# Patient Record
Sex: Female | Born: 1990 | Race: Black or African American | Hispanic: No | Marital: Single | Smoking: Never smoker
Health system: Southern US, Community
[De-identification: ages and names within clinical notes are randomized; demographics above are authoritative.]

## PROBLEM LIST (undated history)

## (undated) DIAGNOSIS — G43909 Migraine, unspecified, not intractable, without status migrainosus: Secondary | ICD-10-CM

## (undated) HISTORY — DX: Migraine, unspecified, not intractable, without status migrainosus: G43.909

## (undated) HISTORY — PX: BREAST REDUCTION SURGERY: SHX8

---

## 2010-08-07 ENCOUNTER — Other Ambulatory Visit: Payer: Self-pay | Admitting: Family Medicine

## 2010-08-07 DIAGNOSIS — E010 Iodine-deficiency related diffuse (endemic) goiter: Secondary | ICD-10-CM

## 2010-08-27 ENCOUNTER — Ambulatory Visit
Admission: RE | Admit: 2010-08-27 | Discharge: 2010-08-27 | Disposition: A | Discharge status: Home / Self Care | Payer: 59 | Source: Ambulatory Visit | Attending: Family Medicine | Admitting: Family Medicine

## 2010-08-27 DIAGNOSIS — E010 Iodine-deficiency related diffuse (endemic) goiter: Secondary | ICD-10-CM

## 2012-01-21 IMAGING — US US SOFT TISSUE HEAD/NECK
1 series · 14 of 25 positions shown · non-contrast
Comparison: None.

CLINICAL DATA: Enlarged thyroid

THYROID ULTRASOUND
TECHNIQUE: Ultrasound examination of the thyroid gland and adjacent
soft tissues was performed.

[Series 1: us soft tissue head/neck · 0.06mm/px · 14 of 34 slices shown]
[im 1/34]
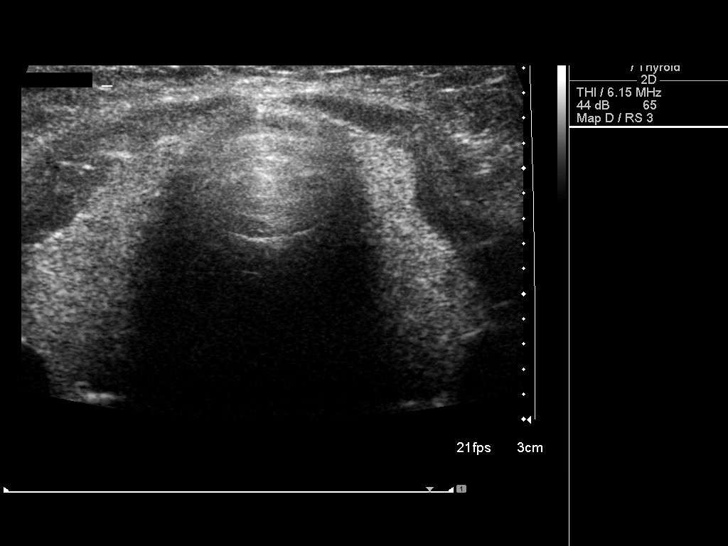
[im 3/34]
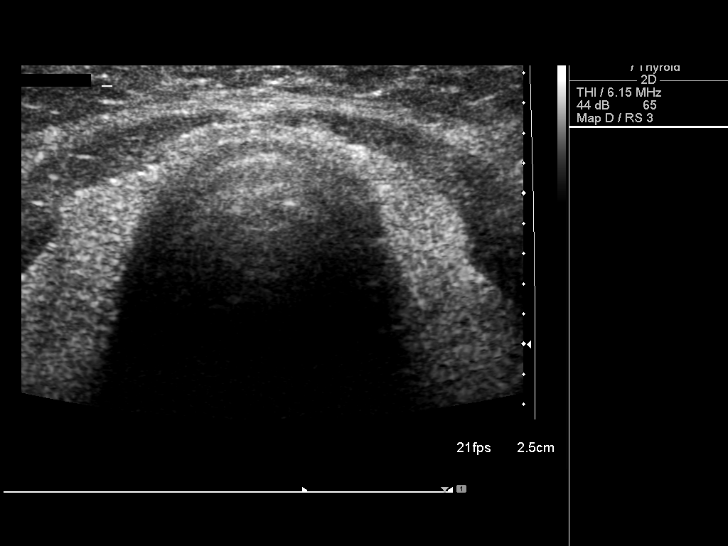
[im 6/34]
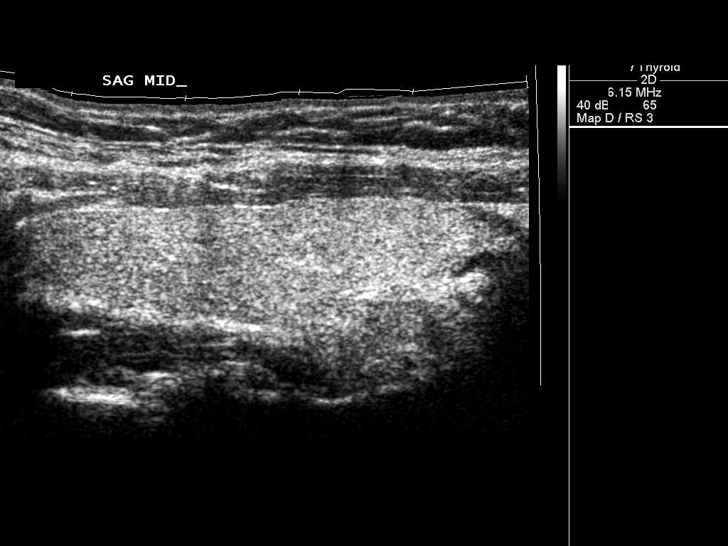
[im 9/34]
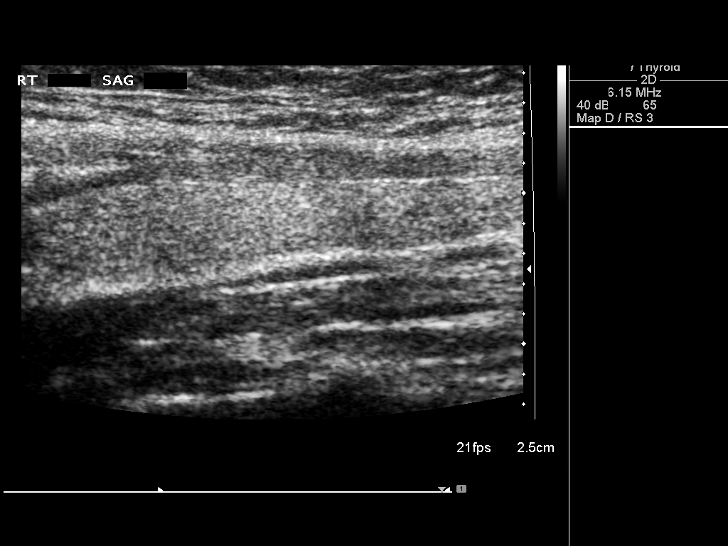
[im 12/34]
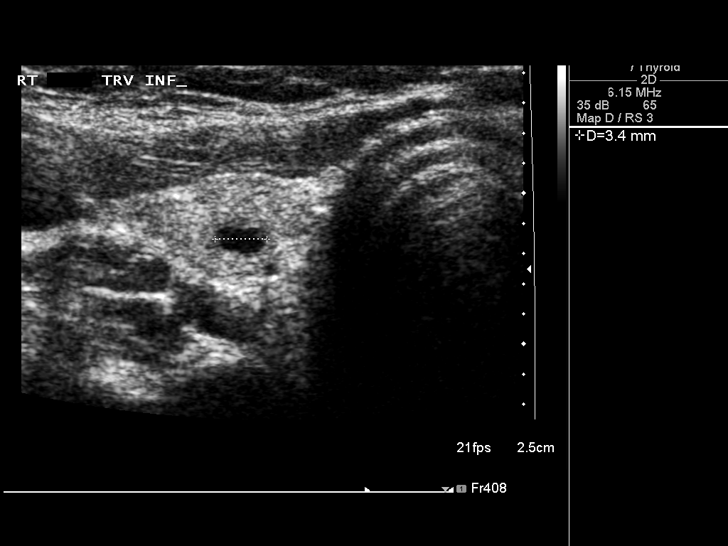
[im 13/34]
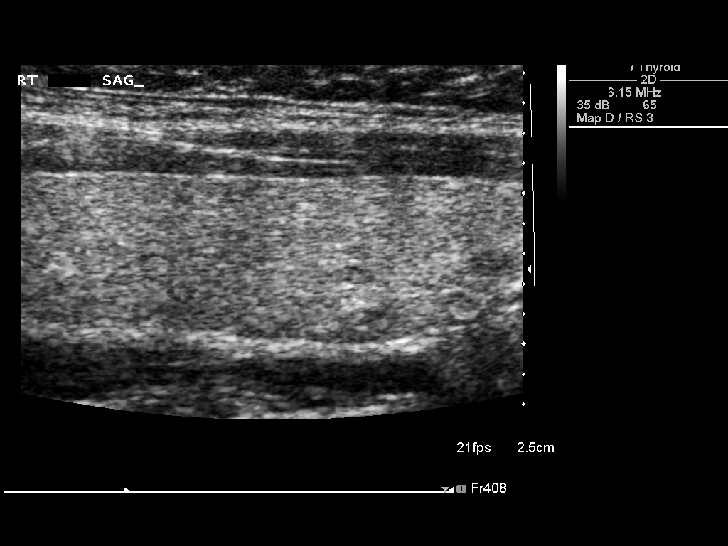
[im 16/34]
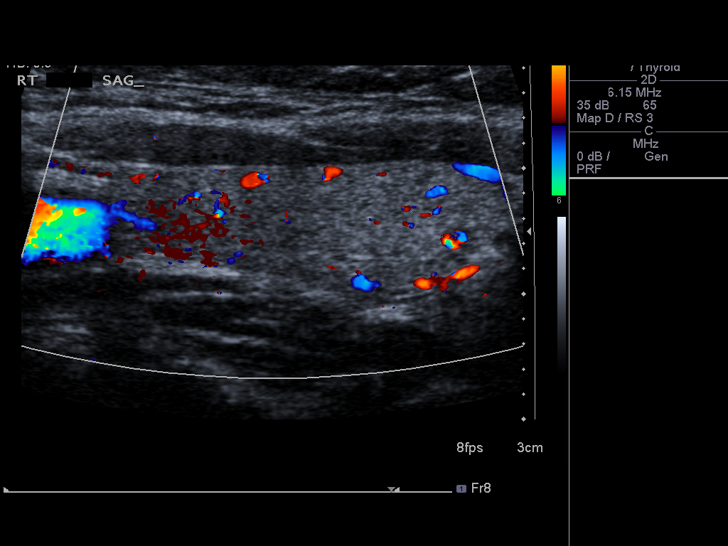
[im 18/34]
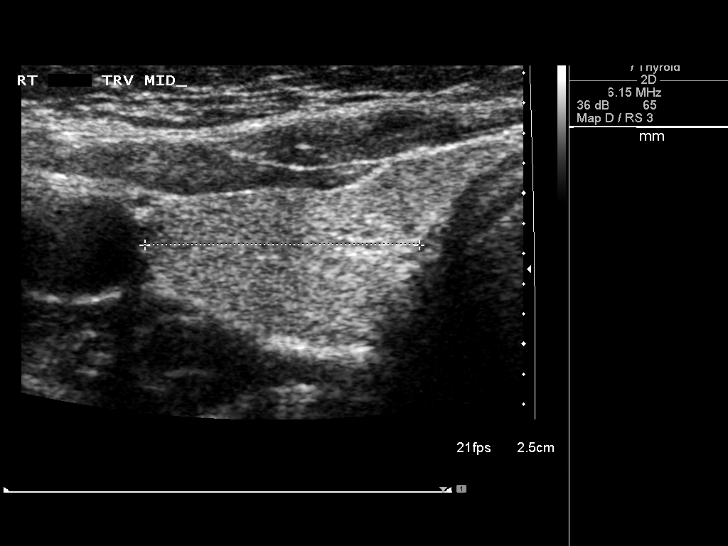
[im 21/34]
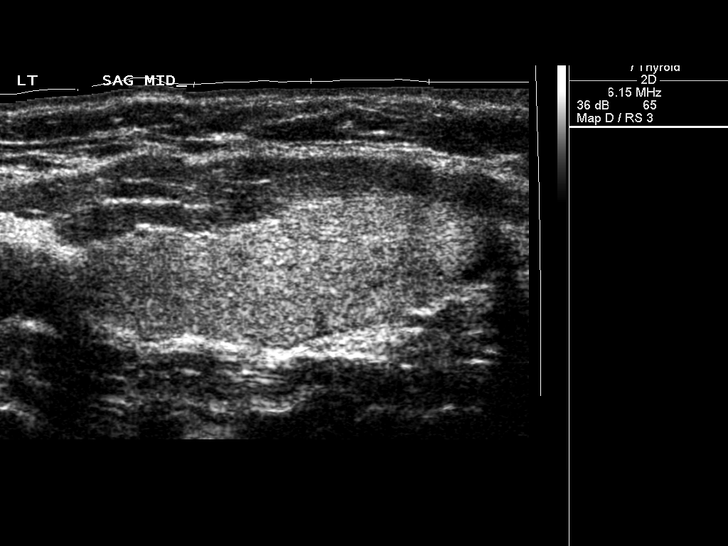
[im 23/34]
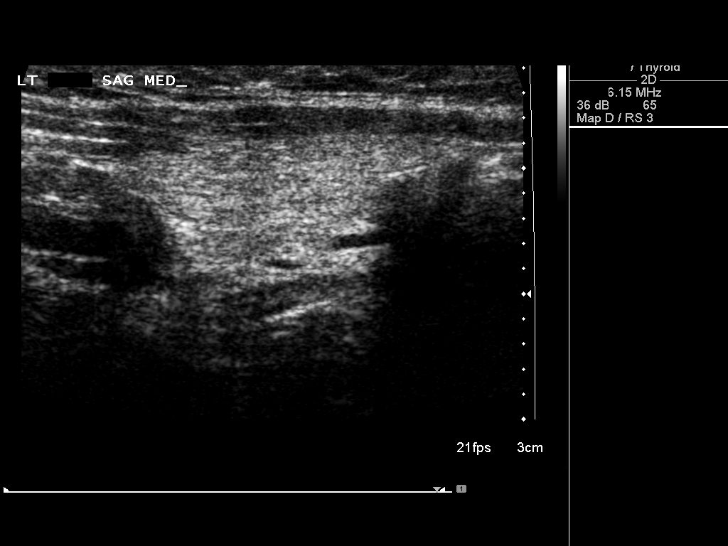
[im 25/34]
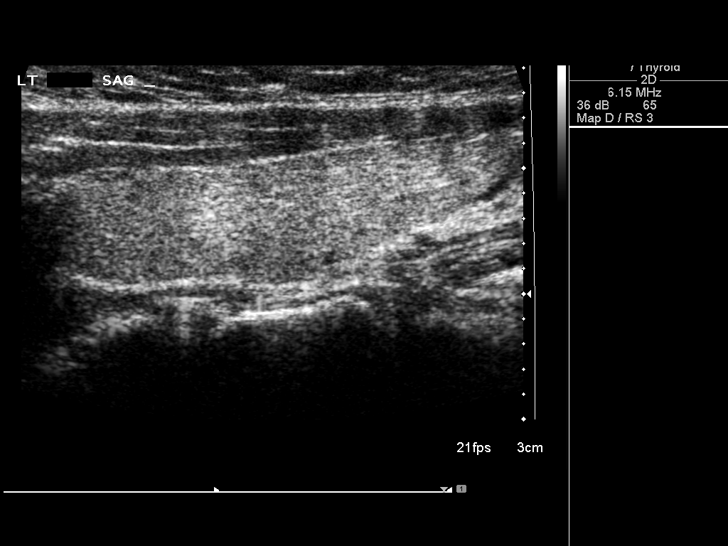
[im 28/34]
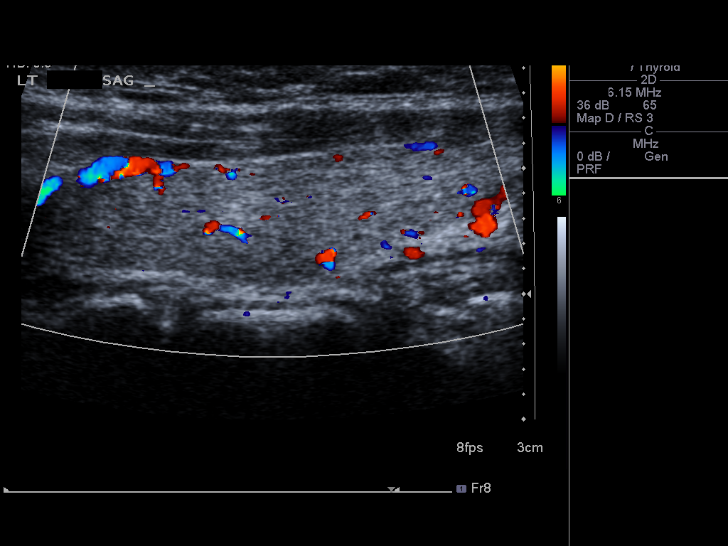
[im 31/34]
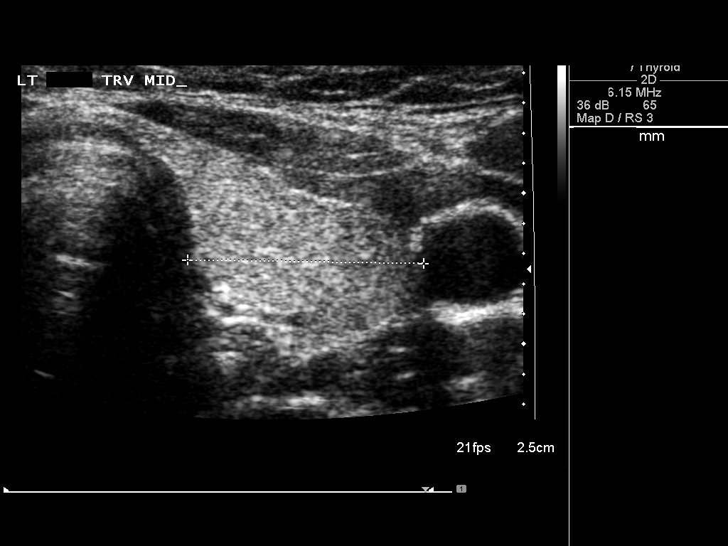
[im 34/34]
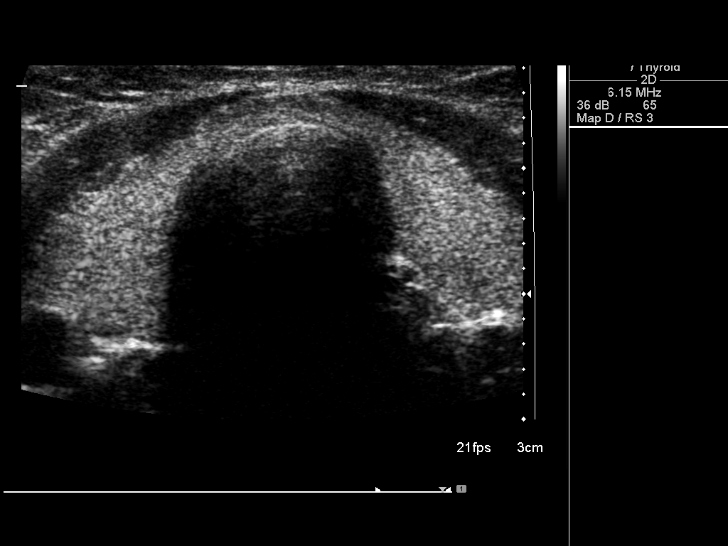

[14 of 25 positions shown; findings below may reference images not displayed]

FINDINGS: Right thyroid lobe:  4.3 x 1.2 x 1.8 cm.
Left thyroid lobe:  3.5 x 1.2 x 1.6 cm
Isthmus:    2 mm.

Focal nodules:  The thyroid gland is relatively homogeneous in
echogenicity. Only a single hypoechoic nodule in the lower pole on
the right is noted of 4 x 3 x 3 mm.

Lymphadenopathy:  None visualized.
IMPRESSION: The thyroid gland is normal in size with only a tiny hypoechoic
nodule of 4 mm in the lower pole on the right.

## 2015-03-29 ENCOUNTER — Other Ambulatory Visit: Payer: Self-pay | Admitting: Obstetrics and Gynecology

## 2015-03-29 ENCOUNTER — Other Ambulatory Visit (HOSPITAL_COMMUNITY)
Admission: RE | Admit: 2015-03-29 | Discharge: 2015-03-29 | Disposition: A | Discharge status: Home / Self Care | Payer: Self-pay | Source: Ambulatory Visit | Attending: Obstetrics and Gynecology | Admitting: Obstetrics and Gynecology

## 2015-03-29 DIAGNOSIS — Z01419 Encounter for gynecological examination (general) (routine) without abnormal findings: Secondary | ICD-10-CM | POA: Insufficient documentation

## 2015-03-31 LAB — CYTOLOGY - PAP

## 2015-06-17 ENCOUNTER — Telehealth: Payer: Self-pay | Admitting: Certified Nurse Midwife

## 2015-06-17 NOTE — Telephone Encounter (Signed)
Tiffany Cooper (06/12/1990), Pt of Dr. Dion BodyVarnado, called via [facility] answering service.  Return call to patient revealed  complaints of a vaginal bacterial infection and requesting a prescription to treat the infection.   Pt states "I know what they look like, have had one before".  Reports odor and increased vaginal discharge.  Had a paraguard placed 2 week ago.  Pt informed that CNM is not able to provide a perscription without evaluation. Pt assured discharge and odor is not emergency and advised to seek evaluation on Monday with her Provider.  Pt stated she understands and was asked what can be done to help before Monday.  Cotton underwear and loose fitting clothing suggested for vaginal hygiene prior to treatment.  Aretta Nip. Amoria Mclees, CNM

## 2016-10-14 ENCOUNTER — Ambulatory Visit: Payer: BLUE CROSS/BLUE SHIELD | Admitting: Neurology

## 2016-10-15 ENCOUNTER — Encounter: Payer: Self-pay | Admitting: Neurology

## 2022-02-08 LAB — HM PAP SMEAR: HM Pap smear: ABNORMAL

## 2022-03-21 ENCOUNTER — Ambulatory Visit: Payer: Self-pay | Admitting: Family Medicine

## 2022-12-03 ENCOUNTER — Encounter: Payer: Self-pay | Admitting: Family

## 2022-12-03 ENCOUNTER — Ambulatory Visit (INDEPENDENT_AMBULATORY_CARE_PROVIDER_SITE_OTHER): Payer: Commercial Managed Care - PPO | Admitting: Family

## 2022-12-03 VITALS — BP 112/72 | HR 69 | Ht 61.0 in | Wt 163.0 lb

## 2022-12-03 DIAGNOSIS — G43009 Migraine without aura, not intractable, without status migrainosus: Secondary | ICD-10-CM | POA: Diagnosis not present

## 2022-12-03 DIAGNOSIS — Z23 Encounter for immunization: Secondary | ICD-10-CM

## 2022-12-03 NOTE — Patient Instructions (Signed)
Welcome to [facility], It was a pleasure meeting you today!   We talked about Sumatriptan (Imitrex) as an as needed treatment for your migraines. Let me know if this is something you want to try. You can try taking 1-2 tablets of Naproxen (Aleve) starting 1-2 days prior to your menstrual cycle and during the cycle to prevent or minimize migraines during this time. See the handout attached regarding migraines.  You can schedule a physical with fasting labs at your convenience today.    PLEASE NOTE: If you had any LAB tests please let us know if you have not heard back within a few days. You may see your results on MyChart before we have a chance to review them but we will give you a call once they are reviewed by [place]. If we ordered any REFERRALS today, please let us know if you have not heard from their office within the next week.  Let us know through MyChart if you are needing REFILLS, or have your pharmacy send [place] the request. You can also use MyChart to communicate with me or any office staff.  Please try these tips to maintain a healthy lifestyle: It is important that you exercise regularly at least 30 minutes 5 times a week. Think about what you will eat, plan ahead. Choose whole foods, & think  "clean, green, fresh or frozen" over canned, processed or packaged foods which are more sugary, salty, and fatty. 70 to 75% of food eaten should be fresh vegetables and protein. 2-3  meals daily with healthy snacks between meals, but must be whole fruit, protein or vegetables. Aim to eat over a 10 hour period when you are active, for example, 7am to 5pm, and then STOP after your last meal of the day, drinking only water.  Shorter eating windows, 6-8 hours, are showing benefits in heart disease and blood sugar regulation. Drink water every day! Shoot for 64 ounces daily = 8 cups, no other drink is as healthy! Fruit juice is best enjoyed in a healthy way, by EATING the  fruit.

## 2022-12-03 NOTE — Progress Notes (Signed)
New Patient Office Visit  Subjective:  Patient ID: Tiffany Cooper, female    DOB: 10-Jan-1991  Age: 32 y.o. MRN: [id number]  CC:  Chief Complaint  Patient presents with   Establish Care    Pt states she recently has been having more migraines; Pt has had meds for issue before and did not like the way it made her feel.   Last Pap: 2023- Does not have GYN Yes to tdap.     HPI Tiffany Cooper presents for establishing care today.  *Discussed the use of AI scribe software for clinical note transcription with the patient, who gave verbal consent to proceed.  History of Present Illness   The patient, a flight attendant, presents as a new patient, with a long-standing history of migraines. She reports an increase in the severity and frequency of these migraines over the past two and a half months, with two episodes being particularly extreme. One episode was related to her menses (usually occurs before & during cycle), while the other occurred during a viral illness. The patient also developed a cold sore during this time. 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 She also reports being seen for migraines in her 94s - had migrainous vertigo- but no episodes since that time. She also does not have nausea with current migraines. The patient's migraines typically do not present with auras. However, during the first severe episode, she experienced extreme pain triggered by smells, which was unusual for her. The patient manages her migraines with over-the-counter medications, including Tylenol and Excedrin, which are usually effective if taken at the onset of symptoms. The patient has a family history of migraines, with her mother having suffered from severe migraines when she was  younger. The patient has been keeping a headache journal to track her symptoms and potential triggers.      Assessment & Plan:     Migraines - History of migraines, with recent increase in severity and frequency. Migraines are typically managed with over-the-counter medications (Tylenol, Excedrin). Patient experiences migraines associated with menstrual cycle and has a family history of migraines. Recent migraines have been associated with sensory changes (increased sensitivity to smells). -Consider prescription for sumatriptan to be used as needed for severe migraines. -Advise patient to continue tracking migraines in headache journal. -Recommend taking 1-2 tabs of OTC naproxen bid a couple of days before menstrual cycle to offset hormone-related migraines. -Follow-up as needed if migraines increase in frequency or severity, or if patient wishes to try prescription medication.  General Health Maintenance -Schedule a physical with fasting labs at patient's convenience.     Immunization due -     Tdap vaccine greater than or equal to 7yo IM  Subjective:    No outpatient medications prior to visit.   No facility-administered medications prior to visit.   History reviewed. No pertinent past medical history. History reviewed. No pertinent surgical history.  Objective:   Today's Vitals: BP 112/72   Pulse 69   Ht 5\' 1"  (1.549 m)   Wt 163 lb (73.9 kg)   LMP 12/02/2022   SpO2 99%   BMI 30.80 kg/m   Physical Exam Vitals and nursing note reviewed.  Constitutional:      Appearance: Normal appearance.  Cardiovascular:     Rate and Rhythm: Normal  rate and regular rhythm.  Pulmonary:     Effort: Pulmonary effort is normal.     Breath sounds: Normal breath sounds.  Musculoskeletal:        General: Normal range of motion.  Skin:    General: Skin is warm and dry.  Neurological:     Mental Status: She is alert.  Psychiatric:        Mood and Affect: Mood normal.        Behavior:  Behavior normal.     Dulce Sellar, NP

## 2022-12-03 NOTE — Assessment & Plan Note (Signed)
Chronic, intermittent, mother has migraine hx recent increase in severity and frequency managed with over-the-counter medications (Tylenol, Excedrin) experiences migraines associated with menstrual cycle Recent migraines have been associated with increased sensitivity to smells Discussed sumatriptan to be used as needed for severe migraines. continue tracking migraines in headache journal. Recommend taking 1-2 tabs of OTC naproxen bid a couple of days before menstrual cycle to offset hormone-related migraines. Follow-up as needed

## 2022-12-03 NOTE — Progress Notes (Deleted)
  Phone [phone]  Subjective:   Patient is a 32 y.o. female presenting for annual physical.    No chief complaint on file.   See problem oriented charting- ROS- full  review of systems was completed and negative except for *** noted in HPI above.  The following were reviewed and entered/updated in epic: No past medical history on file. There are no problems to display for this patient.  *** The histories are not reviewed yet. Please review them in the "History" navigator section and refresh this SmartLink.  No family history on file.  Medications- reviewed and updated No current outpatient medications on file.   No current facility-administered medications for this visit.    Allergies-reviewed and updated Not on File  Social History   Social History Narrative   Not on file    Objective:  There were no vitals taken for this visit. Physical Exam Vitals and nursing note reviewed.  Constitutional:      Appearance: Normal appearance.  HENT:     Head: Normocephalic.     Right Ear: Tympanic membrane normal.     Left Ear: Tympanic membrane normal.     Nose: Nose normal.     Mouth/Throat:     Mouth: Mucous membranes are moist.  Eyes:     Pupils: Pupils are equal, round, and reactive to light.  Cardiovascular:     Rate and Rhythm: Normal rate and regular rhythm.  Pulmonary:     Effort: Pulmonary effort is normal.     Breath sounds: Normal breath sounds.  Musculoskeletal:        General: Normal range of motion.     Cervical back: Normal range of motion.  Lymphadenopathy:     Cervical: No cervical adenopathy.  Skin:    General: Skin is warm and dry.  Neurological:     Mental Status: She is alert.  Psychiatric:        Mood and Affect: Mood normal.        Behavior: Behavior normal.       Assessment and Plan   Health Maintenance counseling: 1. Anticipatory guidance: Patient counseled regarding regular dental exams q6 months, eye exams,  avoiding smoking  and second hand smoke, limiting alcohol to 1 beverage per day, no illicit drugs.   2. Risk factor reduction:  Advised patient of need for regular exercise and diet rich with fruits and vegetables to reduce risk of heart attack and stroke. Wt Readings from Last 3 Encounters:  No data found for Wt   3. Immunizations/screenings/ancillary studies  There is no immunization history on file for this patient. Health Maintenance Due  Topic Date Due   HIV Screening  Never done   Hepatitis C Screening  Never done   DTaP/Tdap/Td (1 - Tdap) Never done   Cervical Cancer Screening (HPV/Pap Cotest)  04/29/2020   INFLUENZA VACCINE  Never done   COVID-19 Vaccine (1 - 2023-24 season) Never done    4. Cervical cancer screening: *** 5. Skin cancer screening- advised regular sunscreen use. Denies worrisome, changing, or new skin lesions.  6. Birth control/STD check: *** 7. Smoking associated screening: *** smoker 8. Alcohol screening: ***  There are no diagnoses linked to this encounter.  Recommended follow up: ***No follow-ups on file. Future Appointments  Date Time Provider Department Center  12/03/2022  9:00 AM Dulce Sellar, NP [facility]    Lab/Order associations:fasting    Dulce Sellar, NP

## 2023-01-03 ENCOUNTER — Telehealth: Payer: Self-pay | Admitting: Family

## 2023-01-03 DIAGNOSIS — G43009 Migraine without aura, not intractable, without status migrainosus: Secondary | ICD-10-CM

## 2023-01-03 NOTE — Telephone Encounter (Signed)
Patient states she would like to try Sumatriptan (Imitrex) as an as needed treatment for migraines.   Requests RX for the above medication be sent to:  [facility] [address] [place], [place] - [address] Phone: [phone]  Fax: [phone]

## 2023-01-07 MED ORDER — CYCLOBENZAPRINE HCL 5 MG PO TABS: 5.0000 mg | ORAL_TABLET | Freq: Three times a day (TID) | ORAL | 2 refills | Status: DC | PRN

## 2023-01-07 MED ORDER — SUMATRIPTAN SUCCINATE 50 MG PO TABS: 50.0000 mg | ORAL_TABLET | ORAL | 2 refills | Status: DC | PRN

## 2023-01-07 NOTE — Telephone Encounter (Signed)
I have in med - also sent in generic Flexeril to take with the sumatriptan for better results- instructions are on the bottles

## 2023-01-07 NOTE — Telephone Encounter (Signed)
I called pt and LVM in regards to message below.

## 2023-01-08 ENCOUNTER — Ambulatory Visit: Payer: Commercial Managed Care - PPO | Admitting: Family

## 2023-01-08 VITALS — BP 108/76 | HR 73 | Temp 98.0°F | Ht 61.0 in | Wt 161.5 lb

## 2023-01-08 DIAGNOSIS — Z1159 Encounter for screening for other viral diseases: Secondary | ICD-10-CM | POA: Diagnosis not present

## 2023-01-08 DIAGNOSIS — B009 Herpesviral infection, unspecified: Secondary | ICD-10-CM | POA: Insufficient documentation

## 2023-01-08 DIAGNOSIS — Z0000 Encounter for general adult medical examination without abnormal findings: Secondary | ICD-10-CM | POA: Diagnosis not present

## 2023-01-08 LAB — CBC WITH DIFFERENTIAL/PLATELET
Basophils Absolute: 0 10*3/uL (ref 0.0–0.1)
Basophils Relative: 0.5 % (ref 0.0–3.0)
Eosinophils Absolute: 0.1 10*3/uL (ref 0.0–0.7)
Eosinophils Relative: 1.7 % (ref 0.0–5.0)
HCT: 41.5 % (ref 36.0–46.0)
Hemoglobin: 14.2 g/dL (ref 12.0–15.0)
Lymphocytes Relative: 23.7 % (ref 12.0–46.0)
Lymphs Abs: 1.5 10*3/uL (ref 0.7–4.0)
MCHC: 34.2 g/dL (ref 30.0–36.0)
MCV: 90 fL (ref 78.0–100.0)
Monocytes Absolute: 0.4 10*3/uL (ref 0.1–1.0)
Monocytes Relative: 6.3 % (ref 3.0–12.0)
Neutro Abs: 4.4 10*3/uL (ref 1.4–7.7)
Neutrophils Relative %: 67.8 % (ref 43.0–77.0)
Platelets: 346 10*3/uL (ref 150.0–400.0)
RBC: 4.61 Mil/uL (ref 3.87–5.11)
RDW: 12.5 % (ref 11.5–15.5)
WBC: 6.4 10*3/uL (ref 4.0–10.5)

## 2023-01-08 LAB — COMPREHENSIVE METABOLIC PANEL
ALT: 12 U/L (ref 0–35)
AST: 18 U/L (ref 0–37)
Albumin: 4.4 g/dL (ref 3.5–5.2)
Alkaline Phosphatase: 47 U/L (ref 39–117)
BUN: 6 mg/dL (ref 6–23)
CO2: 27 meq/L (ref 19–32)
Calcium: 9.3 mg/dL (ref 8.4–10.5)
Chloride: 104 meq/L (ref 96–112)
Creatinine, Ser: 0.78 mg/dL (ref 0.40–1.20)
GFR: 100.31 mL/min (ref >60.00)
Glucose, Bld: 85 mg/dL (ref 70–99)
Potassium: 3.9 meq/L (ref 3.5–5.1)
Sodium: 137 meq/L (ref 135–145)
Total Bilirubin: 0.5 mg/dL (ref 0.2–1.2)
Total Protein: 7.4 g/dL (ref 6.0–8.3)

## 2023-01-08 LAB — LIPID PANEL
Cholesterol: 190 mg/dL (ref 0–200)
HDL: 52.6 mg/dL (ref >39.00)
LDL Cholesterol: 120 mg/dL — ABNORMAL HIGH (ref 0–99)
NonHDL: 137.46
Total CHOL/HDL Ratio: 4
Triglycerides: 86 mg/dL (ref 0.0–149.0)
VLDL: 17.2 mg/dL (ref 0.0–40.0)

## 2023-01-08 LAB — TSH: TSH: 1.5 u[IU]/mL (ref 0.35–5.50)

## 2023-01-08 NOTE — Assessment & Plan Note (Signed)
started Imitrex w/Flexeril via MyChart message 12/2022

## 2023-01-08 NOTE — Progress Notes (Signed)
Phone [phone]  Subjective:   Patient is a 32 y.o. female presenting for annual physical.    Chief Complaint  Patient presents with   Annual Exam    Fasting w/ labs    See problem oriented charting- ROS- full  review of systems was completed and negative.  The following were reviewed and entered/updated in epic: No past medical history on file. 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  No family history on file.  Medications- reviewed and updated Current Outpatient Medications  Medication Sig Dispense Refill   cyclobenzaprine (FLEXERIL) 5 MG tablet Take 1-2 tablets (5-10 mg total) by mouth 3 (three) times daily as needed for muscle spasms (Migraine). Take at first sign of migraine with Sumatriptan 30 tablet 2   SUMAtriptan (IMITREX) 50 MG tablet Take 1-2 tablets (50-100 mg total) by mouth every 2 (two) hours as needed for migraine (Take at first sign of migraine with Flexeril). May repeat in 2 hours if headache persists or recurs. 30 tablet 2   No current facility-administered medications for this visit.    Allergies-reviewed and updated Allergies  Allergen Reactions   Other     Oysters     Social History   Social History Narrative   Not on file    Objective:  BP 108/76 (BP Location: Left Arm, Patient Position: Sitting, Cuff Size: Large)   Pulse 73   Temp 98 F (36.7 C) (Temporal)   Ht 5\' 1"  (1.549 m)   Wt 161 lb 8 oz (73.3 kg)   LMP 12/28/2022 (Exact Date)   SpO2 98%   BMI 30.52 kg/m  Physical Exam Vitals and nursing note reviewed.  Constitutional:      Appearance: Normal appearance.  HENT:     Head: Normocephalic.     Right Ear: Tympanic membrane normal.     Left Ear: Tympanic membrane normal.     Nose: Nose normal.     Mouth/Throat:     Mouth: Mucous membranes  are moist.  Eyes:     Pupils: Pupils are equal, round, and reactive to light.  Cardiovascular:     Rate and Rhythm: Normal rate and regular rhythm.  Pulmonary:     Effort: Pulmonary effort is normal.     Breath sounds: Normal breath sounds.  Musculoskeletal:        General: Normal range of motion.     Cervical back: Normal range of motion.  Lymphadenopathy:     Cervical: No cervical adenopathy.  Skin:    General: Skin is warm and dry.  Neurological:     Mental Status: She is alert.  Psychiatric:        Mood and Affect: Mood normal.        Behavior: Behavior normal.      Assessment and Plan   Health Maintenance counseling: 1. Anticipatory guidance: Patient counseled regarding regular dental exams q6 months, eye exams,  avoiding smoking and second hand smoke, limiting alcohol to 1 beverage per day, no illicit drugs.   2. Risk factor reduction:  Advised patient of need for regular exercise and diet rich with fruits and vegetables to reduce risk of heart attack and stroke. Wt Readings from Last 3 Encounters:  01/08/23 161 lb 8 oz (73.3 kg)  12/03/22 163 lb (73.9 kg)   3. Immunizations/screenings/ancillary studies Immunization  History  Administered Date(s) Administered   MODERNA COVID-19 SARS-COV-2 PEDS BIVALENT BOOSTER 41yr-25yr 07/26/2020, 09/25/2020   Tdap 12/03/2022   Health Maintenance Due  Topic Date Due   HIV Screening  Never done   Hepatitis C Screening  Never done   Cervical Cancer Screening (HPV/Pap Cotest)  04/29/2020   COVID-19 Vaccine (3 - 2023-24 season) 10/27/2022    4. Cervical cancer screening: due 2025 5. Skin cancer screening- advised regular sunscreen use. Denies worrisome, changing, or new skin lesions.  6. Birth control/STD check: Paragard - reports hx of 2 pregnancies while on paragard, prefers not to use hormonal BC 7. Smoking associated screening: non- smoker 8. Alcohol screening: rare  Annual physical exam -     HIV Antibody (routine testing w  rflx) -     TSH -     Lipid panel -     CBC with Differential/Platelet -     Comprehensive metabolic panel  Need for hepatitis C screening test -     Hepatitis C antibody   Recommended follow up:  Return in about 6 months (around 07/08/2023) for med refills. Future Appointments  Date Time Provider Department Center  07/09/2023 11:30 AM Dulce Sellar, NP [facility]    Lab/Order associations: fasting    Dulce Sellar, NP

## 2023-01-08 NOTE — Patient Instructions (Addendum)
It was very nice to see you today!   I will review your lab results via MyChart in a few days.   Have a great week!    PLEASE NOTE:  If you had any lab tests please let us know if you have not heard back within a few days. You may see your results on MyChart before we have a chance to review them but we will give you a call once they are reviewed by [place]. If we ordered any referrals today, please let us know if you have not heard from their office within the next week.

## 2023-01-09 LAB — HEPATITIS C ANTIBODY: Hepatitis C Ab: NONREACTIVE

## 2023-01-09 LAB — HIV ANTIBODY (ROUTINE TESTING W REFLEX): HIV 1&2 Ab, 4th Generation: NONREACTIVE

## 2023-01-09 NOTE — Progress Notes (Signed)
Glucose, electrolytes, blood count, thyroid, liver & kidney function are all normal.  Cholesterol numbers are good & just LDL (bad #) is high. Try to reduce any fried foods, fatty meat (red meat), high fat dairy foods:  including cheese, milk, ice cream, also nonnutritional snacks e.g. chips/cookies,pies, cakes, etc  Continue or restart an exercise routine, shooting for 5-7days per week.

## 2023-01-13 ENCOUNTER — Telehealth: Payer: Self-pay | Admitting: Family

## 2023-01-13 NOTE — Telephone Encounter (Signed)
Patient states medication for migraines given has been helpful, but she is experiencing current migraine. States has been onset x 7 days on and off, no unusual sx.    FYI: This call has been transferred to triage nurse: Access Nurse. Once the result note has been entered staff can address the message at that time.  Patient called in with the following symptoms:  Red Word: Ongoing migraine; medical intervention ineffective    Please advise at Mobile [phone] (mobile)  Message is routed to Provider Pool.

## 2023-01-13 NOTE — Telephone Encounter (Signed)
Misty Stanley, Access Nurse, called stating final outcome was for pt to be seen PCP within 24 hours. Patient has been scheduled with PCP for 01/14/23 @ 11 am. Awaiting triage note.

## 2023-01-13 NOTE — Telephone Encounter (Signed)
Final Outcome: See PCP within 24 hours.   Patient Name First: Tiffany Last: Cooper Gender: Female DOB: 1990/11/14 Age: 32 Y 8 M 13 D Return Phone Number: [phone] (Primary) Address: City/ State/ Zip: [place]  [address] Client [facility] - [facility], [facility] at [facility] Contact Type Call Who Is Calling Patient / Member / Family / Caregiver Call Type Triage / Clinical Relationship To Patient Self Return Phone Number [phone] (Primary) Chief Complaint Headache Reason for Call Symptomatic / Request for Health Information Initial Comment Caller states she has had a headache for 7 days on and off and she been taking a medication but it has not helped. Translation No Nurse Assessment Nurse: Doylene Canard, RN, Lesa Date/Time (Eastern Time): 01/13/2023 11:16:52 AM Confirm and document reason for call. If symptomatic, describe symptoms. ---Caller states she has a headache. Excedrin, Flexeril, Cyclobenzaprine and Tylenol is not helping. Does the patient have any new or worsening symptoms? ---Yes Will a triage be completed? ---Yes Related visit to physician within the last 2 weeks? ---Yes Does the PT have any chronic conditions? (i.e. diabetes, asthma, this includes High risk factors for pregnancy, etc.) ---Yes List chronic conditions. ---migraines Is the patient pregnant or possibly pregnant? (Ask all females between the ages of 9-55) ---No Is this a behavioral health or substance abuse call? ---No Guidelines Guideline Title Affirmed Question Affirmed Notes Nurse Date/Time (Eastern Time) Headache [1] MODERATE headache (e.g., interferes with normal activities) AND [2] present > 24 hours AND [3] unexplained Conner, RN, Lesa 01/13/2023 11:20:33 AM Guidelines Guideline Title Affirmed Question Affirmed Notes Nurse Date/Time (Eastern Time) (Exceptions: Pain medicines not tried, typical migraine,  or headache part of viral illness.) Disp. Time Lamount Cohen Time) Disposition Final User 01/13/2023 11:28:30 AM See PCP within 24 Hours Yes Doylene Canard, RN, Gean Maidens Final Disposition 01/13/2023 11:28:30 AM See PCP within 24 Hours Yes Conner, RN, Gean Maidens Caller Disagree/Comply Comply Caller Understands Yes PreDisposition Home Care Care Advice Given Per Guideline SEE PCP WITHIN 24 HOURS: * IF OFFICE WILL BE OPEN: You need to be examined within the next 24 hours. Call your doctor (or NP/PA) when the office opens and make an appointment. PAIN MEDICINES: * For pain relief, you can take either acetaminophen, ibuprofen, or naproxen. * ACETAMINOPHEN - REGULAR STRENGTH TYLENOL: Take 650 mg (two 325 mg pills) by mouth every 4 to 6 hours as needed. Each Regular Strength Tylenol pill has 325 mg of acetaminophen. The most you should take is 10 pills a day (3,250 mg total). Note: In [place], the maximum is 12 pills a day (3,900 mg total). * IBUPROFEN (SUCH AS MOTRIN, ADVIL): Take 400 mg (two 200 mg pills) by mouth every 6 hours. The most you should take is 6 pills a day (1,200 mg total). * NAPROXEN (SUCH AS ALEVE): Take 220 mg (one 220 mg pill) by mouth every 8 to 12 hours as needed. You may take 440 mg (two 220 mg pills) for your first dose. The most you should take is 3 pills a day (660 mg total). Note: In [place], the maximum is 2 pills a day (one every 12 hours; 440 mg total). * Use the lowest amount of medicine that makes your pain better. * Before taking any medicine, read all the instructions on the package. * Follow these dosing instructions unless your doctor (or NP/ PA) has told you to take a different dose. PAIN MEDICINES - EXTRA NOTES AND WARNINGS: REST FOR HEADACHE: Lorenz Coaster  down in a dark quiet place and relax until feeling better. * Close your eyes and imagine your entire body relaxing. COLD PACK FOR HEADACHE: * Put a cold pack or an ice bag (wrapped in a moist towel) on the forehead. * Do this for 20  minutes. STRETCHING: * Stretch and massage any tight neck muscles. CALL BACK IF: * You become worse CARE ADVICE given per Headache (Adult) guideline. Referrals Warm transfer to backline

## 2023-01-14 ENCOUNTER — Ambulatory Visit (INDEPENDENT_AMBULATORY_CARE_PROVIDER_SITE_OTHER): Payer: Commercial Managed Care - PPO | Admitting: Family

## 2023-01-14 VITALS — BP 99/65 | HR 83 | Temp 98.0°F | Ht 61.0 in | Wt 162.1 lb

## 2023-01-14 DIAGNOSIS — G43009 Migraine without aura, not intractable, without status migrainosus: Secondary | ICD-10-CM

## 2023-01-14 MED ORDER — METOPROLOL SUCCINATE ER 25 MG PO TB24: 12.5000 mg | ORAL_TABLET | Freq: Every day | ORAL | 2 refills | Status: DC

## 2023-01-14 MED ORDER — SUMATRIPTAN SUCCINATE 100 MG PO TABS: 100.0000 mg | ORAL_TABLET | ORAL | 2 refills | Status: DC | PRN

## 2023-01-14 MED ORDER — NAPROXEN 500 MG PO TABS: 500.0000 mg | ORAL_TABLET | Freq: Every day | ORAL | 2 refills | Status: DC

## 2023-01-14 NOTE — Assessment & Plan Note (Signed)
Persistent headache despite sumatriptan and Flexeril. Patient reports feeling foggy, throbbing pain with exertion, and irritability. Discussed the importance of consistent dosing and potential triggers including diet, stress, dehydration, and environmental factors. -Continue sumatriptan 100mg  as needed for acute episodes. -Add Naproxen 500mg  as an adjunct to sumatriptan for acute episodes. -Can also take Flexeril with Imitrex & Naproxen. -Start Metoprolol 25mg  (half a pill) at bedtime for migraine prophylaxis. -Consider dietary modifications and continue maintaining a migraine journal. -Check back in a few weeks to assess response to Metoprolol and need for further interventions.

## 2023-01-14 NOTE — Progress Notes (Signed)
Patient ID: Tiffany Cooper, female    DOB: 06-Oct-1990, 32 y.o.   MRN: [id number]  Chief Complaint  Patient presents with   Headache    Pt c/o headache since last Wednesday, Has tried Imitrex and cyclobenzaprine which did  help sx.    Discussed the use of AI scribe software for clinical note transcription with the patient, who gave verbal consent to proceed.  History of Present Illness   The patient, a flight attendant, presents with persistent headaches that have been ongoing for an extended period. The headaches are not associated with any specific triggers and do not follow a particular pattern, though in the past they have been associated with her menstrual cycle. The patient has tried 100mg  sumatriptan (Imitrex) with 5mg  Flexeril & reports the 100mg  Imitrex worked faster than 50mg  dose, but the headache persists the next morning when she wakes up. The patient also reports feeling fatigued and irritable. The patient's medical history includes lactose intolerance and high cholesterol. The patient has been trying to manage her diet and exercise regularly, but the headaches persist. The patient also reports feeling lightheaded, cold, and tired. The patient's blood pressure is low, which is a normal condition for her she reports. The patient has been keeping a migraine journal and has not noticed any correlation between her diet, work, or menstrual cycle and the onset of the headaches.    Assessment & Plan:     Migraine - Persistent headache despite sumatriptan and Flexeril. Patient reports feeling foggy, throbbing pain with exertion, and irritability. Discussed the importance of consistent dosing and potential triggers including diet, stress, dehydration, and environmental factors. -Continue sumatriptan 100mg  as needed for acute episodes. -Add Naproxen 500mg  as an adjunct to sumatriptan for acute episodes. -Can also take Flexeril with Imitrex & Naproxen. -Start Metoprolol 25mg  (half a pill) at  bedtime for migraine prophylaxis. -Consider dietary modifications and continue maintaining a migraine journal. -Check back in a few weeks to assess response to Metoprolol and need for further interventions.     Subjective:    Outpatient Medications Prior to Visit  Medication Sig Dispense Refill   cyclobenzaprine (FLEXERIL) 5 MG tablet Take 1-2 tablets (5-10 mg total) by mouth 3 (three) times daily as needed for muscle spasms (Migraine). Take at first sign of migraine with Sumatriptan 30 tablet 2   SUMAtriptan (IMITREX) 50 MG tablet Take 1-2 tablets (50-100 mg total) by mouth every 2 (two) hours as needed for migraine (Take at first sign of migraine with Flexeril). May repeat in 2 hours if headache persists or recurs. 30 tablet 2   No facility-administered medications prior to visit.   No past medical history on file. No past surgical history on file. Allergies  Allergen Reactions   Other     Oysters       Objective:    Physical Exam Vitals and nursing note reviewed.  Constitutional:      Appearance: Normal appearance.  Cardiovascular:     Rate and Rhythm: Normal rate and regular rhythm.  Pulmonary:     Effort: Pulmonary effort is normal.     Breath sounds: Normal breath sounds.  Musculoskeletal:        General: Normal range of motion.  Skin:    General: Skin is warm and dry.  Neurological:     Mental Status: She is alert.  Psychiatric:        Mood and Affect: Mood normal.        Behavior: Behavior normal.  BP 99/65 (BP Location: Left Arm, Patient Position: Sitting, Cuff Size: Normal)   Pulse 83   Temp 98 F (36.7 C) (Temporal)   Ht 5\' 1"  (1.549 m)   Wt 162 lb 2 oz (73.5 kg)   LMP 12/28/2022 (Exact Date)   SpO2 99%   BMI 30.63 kg/m  Wt Readings from Last 3 Encounters:  01/14/23 162 lb 2 oz (73.5 kg)  01/08/23 161 lb 8 oz (73.3 kg)  12/03/22 163 lb (73.9 kg)      Dulce Sellar, NP

## 2023-01-16 ENCOUNTER — Telehealth: Payer: Self-pay | Admitting: Family

## 2023-01-16 NOTE — Telephone Encounter (Signed)
Received faxed document FMLA, to be filled out by provider. Patient requested to send it back via Fax within 7-days. Document is located in providers tray at front office.Please advise

## 2023-01-16 NOTE — Telephone Encounter (Signed)
Placed in provider's desk.

## 2023-01-29 NOTE — Telephone Encounter (Signed)
Patient requests to be called re: status of FMLA forms-deadline is 02/04/23

## 2023-01-30 NOTE — Telephone Encounter (Signed)
Let Tiffany Cooper know we will fax her fomrs by Monday before 5p at latest.

## 2023-02-03 NOTE — Telephone Encounter (Signed)
Tiffany Cooper states she faxed FMLA forms over this morning.  I have informed patient.

## 2023-02-04 NOTE — Telephone Encounter (Signed)
Patient called stating job informed her that FMLA ppw wasn't received. I informed Toni Amend, CMA about this and she was able to re-fax paperwork.   I called and informed pt of new fax confirmation from this morning. Pt verbalized understanding.

## 2023-03-11 ENCOUNTER — Ambulatory Visit: Payer: Commercial Managed Care - PPO | Admitting: Neurology

## 2023-03-11 ENCOUNTER — Encounter: Payer: Self-pay | Admitting: Neurology

## 2023-03-11 VITALS — BP 103/71 | HR 87 | Ht 60.25 in | Wt 164.2 lb

## 2023-03-11 DIAGNOSIS — H539 Unspecified visual disturbance: Secondary | ICD-10-CM | POA: Diagnosis not present

## 2023-03-11 DIAGNOSIS — G43009 Migraine without aura, not intractable, without status migrainosus: Secondary | ICD-10-CM | POA: Diagnosis not present

## 2023-03-11 DIAGNOSIS — R51 Headache with orthostatic component, not elsewhere classified: Secondary | ICD-10-CM | POA: Diagnosis not present

## 2023-03-11 DIAGNOSIS — R519 Headache, unspecified: Secondary | ICD-10-CM

## 2023-03-11 MED ORDER — UBRELVY 100 MG PO TABS: 100.0000 mg | ORAL_TABLET | ORAL | 0 refills | Status: DC | PRN

## 2023-03-11 MED ORDER — NURTEC 75 MG PO TBDP: 75.0000 mg | ORAL_TABLET | Freq: Every day | ORAL | 0 refills | Status: DC | PRN

## 2023-03-11 NOTE — Progress Notes (Signed)
 [facility]    Provider:  Dr Ines Requesting Provider: Stamey, Chiquita POUR, FNP Primary Care Provider:  Lucius Krabbe, NP  CC:  migraines  HPI:  Tiffany Cooper is a 33 y.o. female here as requested by Stamey, Chiquita POUR, FNP for migraines. has Migraine without aura and without status migrainosus, not intractable and HSV-1 (herpes simplex virus 1) infection on their problem list.  She had headaches since a child worse around puberty. Her mother had migraines. The migraines used to be triggered by severe stress or illness but since she started keeping a journal, she had one so severe even coffee smell made it worse. Noise and brights lights bother her and she is sensitive to these.  Milk or dairy can give her a headache, she is lactose intolerant, she has had them at work or at home, no particular location triggers them, no particular time of day she can feel off before the headache and she can tell. 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 In June she would take tylenol extra strength at the onset and now more resistant to these over the counter meds but she has always used them. Also increased frequency since June 2024 she has had more prologed headaches. She also has associated nausea, no vomiting, movement hurts, throbbing/pulsating/pounding can be unilateral.  No aura.  However she has noticed that she can have vision changes, the headaches can be positional and they are worse in the morning when she wakes up with a migraine so positional, she can have morning and nocturnal migraines, the headaches are worsening.  She can have up to 8 total headache days a month with 4 of those being migrainous ongoing for the last 3 months.No other focal neurologic deficits,  associated symptoms, inciting events or modifiable factors.  Tried: amitriptyline and topamax, sumatriptan , maxalt, BP meds 103/71  Reviewed notes, labs and imaging from outside physicians, which showed:  Recent Results (from the past 2160 hours)  HIV Antibody (routine testing w rflx)     Status: None   Collection Time: 01/08/23 10:59 AM  Result Value Ref Range   HIV 1&2 Ab, 4th Generation NON-REACTIVE NON-REACTIVE    Comment: HIV-1 antigen and HIV-1/HIV-2 antibodies were not detected. There is no laboratory evidence of HIV infection. SABRA PLEASE NOTE: This information has been disclosed to you from records whose confidentiality may be protected by state law.  If your state requires such protection, then the state law prohibits you from making any further disclosure of the information without the specific written consent of the person to whom it pertains, or as otherwise permitted by law. A general authorization for the release of medical or other information is NOT sufficient for this purpose. . For additional information please refer to http://education.questdiagnostics.com/faq/FAQ106 (This link is being provided for informational/ educational purposes only.) . SABRA The performance of this assay has not been clinically validated in patients less than 25 years old. .   TSH     Status: None   Collection Time: 01/08/23 10:59 AM  Result Value Ref Range   TSH 1.50 0.35 - 5.50 uIU/mL  Lipid panel     Status: Abnormal   Collection Time: 01/08/23 10:59 AM  Result Value Ref Range   Cholesterol 190 0 - 200  mg/dL    Comment: ATP III Classification       Desirable:  < 200 mg/dL               Borderline High:  200 - 239 mg/dL          High:  > = 759 mg/dL   Triglycerides 13.9 0.0 - 149.0 mg/dL    Comment: Normal:  <849 mg/dLBorderline High:  150 - 199 mg/dL   HDL 47.39 >60.99 mg/dL   VLDL 82.7 0.0 - 59.9 mg/dL   LDL Cholesterol 879 (H) 0 - 99 mg/dL   Total CHOL/HDL Ratio 4      Comment:                Men          Women1/2 Average Risk     3.4          3.3Average Risk          5.0          4.42X Average Risk          9.6          7.13X Average Risk          15.0          11.0                       NonHDL 137.46     Comment: NOTE:  Non-HDL goal should be 30 mg/dL higher than patient's LDL goal (i.e. LDL goal of < 70 mg/dL, would have non-HDL goal of < 100 mg/dL)  CBC with Differential/Platelet     Status: None   Collection Time: 01/08/23 10:59 AM  Result Value Ref Range   WBC 6.4 4.0 - 10.5 K/uL   RBC 4.61 3.87 - 5.11 Mil/uL   Hemoglobin 14.2 12.0 - 15.0 g/dL   HCT 58.4 63.9 - 53.9 %   MCV 90.0 78.0 - 100.0 fl   MCHC 34.2 30.0 - 36.0 g/dL   RDW 87.4 88.4 - 84.4 %   Platelets 346.0 150.0 - 400.0 K/uL   Neutrophils Relative % 67.8 43.0 - 77.0 %   Lymphocytes Relative 23.7 12.0 - 46.0 %   Monocytes Relative 6.3 3.0 - 12.0 %   Eosinophils Relative 1.7 0.0 - 5.0 %   Basophils Relative 0.5 0.0 - 3.0 %   Neutro Abs 4.4 1.4 - 7.7 K/uL   Lymphs Abs 1.5 0.7 - 4.0 K/uL   Monocytes Absolute 0.4 0.1 - 1.0 K/uL   Eosinophils Absolute 0.1 0.0 - 0.7 K/uL   Basophils Absolute 0.0 0.0 - 0.1 K/uL  Comprehensive metabolic panel     Status: None   Collection Time: 01/08/23 10:59 AM  Result Value Ref Range   Sodium 137 135 - 145 mEq/L   Potassium 3.9 3.5 - 5.1 mEq/L   Chloride 104 96 - 112 mEq/L   CO2 27 19 - 32 mEq/L   Glucose, Bld 85 70 - 99 mg/dL   BUN 6 6 - 23 mg/dL   Creatinine, Ser 9.21 0.40 - 1.20 mg/dL   Total Bilirubin 0.5 0.2 - 1.2 mg/dL   Alkaline Phosphatase 47 39 - 117 U/L   AST 18 0 - 37 U/L   ALT 12 0 - 35 U/L   Total Protein 7.4 6.0 - 8.3 g/dL   Albumin 4.4 3.5 - 5.2 g/dL   GFR 899.68 >39.99 mL/min    Comment: Calculated using the CKD-EPI  Creatinine Equation (2021)   Calcium 9.3 8.4 - 10.5 mg/dL  Hepatitis C Antibody     Status: None   Collection Time: 01/08/23 10:59 AM  Result Value Ref Range   Hepatitis C Ab NON-REACTIVE NON-REACTIVE    Comment:  . HCV antibody was non-reactive. There is no laboratory  evidence of HCV infection. . In most cases, no further action is required. However, if recent HCV exposure is suspected, a test for HCV RNA (test code 64354) is suggested. . For additional information please refer to http://education.quest[facility].com/faq/FAQ22v1 (This link is being provided for informational/ educational purposes only.) .      Review of Systems: Patient complains of symptoms per HPI as well as the following symptoms none. Pertinent negatives and positives per HPI. All others negative.   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, no kids, pets   Social Drivers of Corporate Investment Banker Strain: Not on file  Food Insecurity: Not on file  Transportation Needs: Not on file  Physical Activity: Not on file  Stress: Not on file  Social Connections: Not on file  Intimate Partner Violence: Not on file    Family History  Problem Relation Age of Onset   Migraines Mother     Past Medical History:  Diagnosis Date   Migraine     Patient Active Problem List   Diagnosis Date Noted   HSV-1 (herpes simplex virus 1) infection 01/08/2023   Migraine without aura and without status migrainosus, not intractable 12/03/2022    Past Surgical History:  Procedure Laterality Date   BREAST REDUCTION SURGERY     2011    Current  Outpatient Medications  Medication Sig Dispense Refill   cyclobenzaprine  (FLEXERIL ) 5 MG tablet Take 1-2 tablets (5-10 mg total) by mouth 3 (three) times daily as needed for muscle spasms (Migraine). Take at first sign of migraine with Sumatriptan  30 tablet 2   Rimegepant Sulfate (NURTEC) 75 MG TBDP Take 1 tablet (75 mg total) by mouth daily as needed. For migraines. Take as close to onset of migraine as possible. One daily maximum. 8 tablet 0   SUMAtriptan  (IMITREX ) 100 MG tablet Take 1 tablet (100 mg total) by mouth every 2 (two) hours as needed for migraine (Take at first of migraine with Flexeril  or Naproxen ). May repeat in 2 hours if headache persists or recurs. 20 tablet 2   Ubrogepant  (UBRELVY ) 100 MG TABS Take 1 tablet (100 mg total) by mouth every 2 (two) hours as needed. Maximum 200mg  a day. 6 tablet 0   No current facility-administered medications for this visit.    Allergies as of 03/11/2023 - Review Complete 03/11/2023  Allergen Reaction Noted   Other  12/03/2022    Vitals: BP 103/71 (Cuff Size: Normal)  Pulse 87   Ht 5' 0.25 (1.53 m)   Wt 164 lb 3.2 oz (74.5 kg)   BMI 31.80 kg/m  Last Weight:  Wt Readings from Last 1 Encounters:  03/11/23 164 lb 3.2 oz (74.5 kg)   Last Height:   Ht Readings from Last 1 Encounters:  03/11/23 5' 0.25 (1.53 m)     Physical exam: Exam: Gen: NAD, conversant, well nourised, obese, well groomed                     CV: RRR, no MRG. No Carotid Bruits. No peripheral edema, warm, nontender Eyes: Conjunctivae clear without exudates or hemorrhage  Neuro: Detailed Neurologic Exam  Speech:    Speech is normal; fluent and spontaneous with normal comprehension.  Cognition:    The patient is oriented to person, place, and time;     recent and remote memory intact;     language fluent;     normal attention, concentration,     fund of knowledge Cranial Nerves:    The pupils are equal, round, and reactive to light. The fundi are normal  and spontaneous venous pulsations are present. Visual fields are full to finger confrontation. Extraocular movements are intact. Trigeminal sensation is intact and the muscles of mastication are normal. The face is symmetric. The palate elevates in the midline. Hearing intact. Voice is normal. Shoulder shrug is normal. The tongue has normal motion without fasciculations.   Coordination: nml  Gait: nml  Motor Observation:    No asymmetry, no atrophy, and no involuntary movements noted. Tone:    Normal muscle tone.    Posture:    Posture is normal. normal erect    Strength:    Strength is V/V in the upper and lower limbs.      Sensation: intact to LT     Reflex Exam:  DTR's:    Deep tendon reflexes in the upper and lower extremities are normal bilaterally.   Toes:    The toes are downgoing bilaterally.   Clonus:    Clonus is absent.    Assessment/Plan: 33 year old female here for migraines.  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  However patient has some concerning symptoms and I would recommend an MRI of the brain as below.  MRI of the brain w/wo contrast: MRI brain due to concerning symptoms of morning headaches, positional headaches,vision changes, worsening headaches  to look for space occupying mass, chiari or intracranial hypertension (pseudotumor), strokes, malignancies, vasculidities, demyelination(multiple sclerosis) or other  Discussed options as below.  Patient would like to try acute management with Nurtec or Ubrelvy .  I gave her samples of both.  She will let us  know if she would like a prescription. Can mychart and tell us  if you want prescription or other  New CGRP class of medications Preventatives: Ajovy, Emgality, Aimovig (Monthly injections), Qulipta (daily pill) - also have Botox and Vyepti  CGRP acute medications: Ubrelvy  and Nurtec and Zavzpret  Samples provided:  Nurtec: Long half life so can take it  acutely/preventatively if you feel a migraine may come on in the morning or later int he day. Once daily prn. Also can take every other day for prevention.   Ubrelvy : Please take one tablet at the onset of your headache.  Meds ordered this encounter  Medications   Rimegepant Sulfate (NURTEC) 75 MG TBDP    Sig: Take 1 tablet (75 mg total) by mouth daily as needed.  For migraines. Take as close to onset of migraine as possible. One daily maximum.    Dispense:  8 tablet    Refill:  0   Ubrogepant  (UBRELVY ) 100 MG TABS    Sig: Take 1 tablet (100 mg total) by mouth every 2 (two) hours as needed. Maximum 200mg  a day.    Dispense:  6 tablet    Refill:  0    Cc: Stamey, Chiquita POUR, FNP,  Lucius Krabbe, NP  Onetha Epp, MD  [facility] [address] [place], [place] [address]  Phone [phone] Fax [phone]  I spent over 45 minutes of face-to-face and non-face-to-face time with patient on the  1. Migraine without aura and without status migrainosus, not intractable   2. Morning headache   3. Nocturnal headaches   4. Vision changes   5. Worsening headaches   6. Positional headache   7. Severe headache    diagnosis.  This included previsit chart review, lab review, study review, order entry, electronic health record documentation, patient education on the different diagnostic and therapeutic options, counseling and coordination of care, risks and benefits of management, compliance, or risk factor reduction

## 2023-03-11 NOTE — Patient Instructions (Addendum)
 MRI of the brain w/wo contrast  New CGRP class of medications Preventatives: Ajovy, Emgality, Aimovig (Monthly injections), Qulipta (daily pill) - also have Botox and Vyepti  CGRP acute medications: Ubrelvy  and Nurtec and Zavzpret  Can mychart and tell us  if you want prescription or other  Nurtec: Long half life so can take it acutely/preventatively if you feel a migraine may come on in the morning or later int he day. Once daily prn. Also can take every other day for prevention.   Ubrelvy : Please take one tablet at the onset of your headache. If it does not improve the symptoms please take one additional tabletRimegepant Disintegrating Tablets What is this medication? RIMEGEPANT (ri ME je pant) prevents and treats migraines. It works by blocking a substance in the body that causes migraines. This medicine may be used for other purposes; ask your health care provider or pharmacist if you have questions. COMMON BRAND NAME(S): NURTEC ODT What should I tell my care team before I take this medication? They need to know if you have any of these conditions: Kidney disease Liver disease An unusual or allergic reaction to rimegepant, other medications, foods, dyes, or preservatives Pregnant or trying to get pregnant Breast-feeding How should I use this medication? Take this medication by mouth. Take it as directed on the prescription label. Leave the tablet in the sealed pack until you are ready to take it. With dry hands, open the pack and gently remove the tablet. If the tablet breaks or crumbles, throw it away. Use a new tablet. Place the tablet in the mouth and allow it to dissolve. Then, swallow it. Do not cut, crush, or chew this medication. You do not need water to take this medication. Talk to your care team about the use of this medication in children. Special care may be needed. Overdosage: If you think you have taken too much of this medicine contact a poison control center or emergency  room at once. NOTE: This medicine is only for you. Do not share this medicine with others. What if I miss a dose? This does not apply. This medication is not for regular use. What may interact with this medication? Certain medications for fungal infections, such as fluconazole, itraconazole Rifampin This list may not describe all possible interactions. Give your health care provider a list of all the medicines, herbs, non-prescription drugs, or dietary supplements you use. Also tell them if you smoke, drink alcohol, or use illegal drugs. Some items may interact with your medicine. What should I watch for while using this medication? Visit your care team for regular checks on your progress. Tell your care team if your symptoms do not start to get better or if they get worse. What side effects may I notice from receiving this medication? Side effects that you should report to your care team as soon as possible: Allergic reactions--skin rash, itching, hives, swelling of the face, lips, tongue, or throat Side effects that usually do not require medical attention (report to your care team if they continue or are bothersome): Nausea Stomach pain This list may not describe all possible side effects. Call your doctor for medical advice about side effects. You may report side effects to FDA at 1-800-FDA-1088. Where should I keep my medication? Keep out of the reach of children and pets. Store at room temperature between 20 and 25 degrees C (68 and 77 degrees F). Get rid of any unused medication after the expiration date. To get rid of medications that  are no longer needed or have expired: Take the medication to a medication take-back program. Check with your pharmacy or law enforcement to find a location. If you cannot return the medication, check the label or package insert to see if the medication should be thrown out in the garbage or flushed down the toilet. If you are not sure, ask your care team.  If it is safe to put it in the trash, take the medication out of the container. Mix the medication with cat litter, dirt, coffee grounds, or other unwanted substance. Seal the mixture in a bag or container. Put it in the trash. NOTE: This sheet is a summary. It may not cover all possible information. If you have questions about this medicine, talk to your doctor, pharmacist, or health care provider.  2024 Elsevier/Gold Standard (2021-04-04 00:00:00)  in 2 hours or later. Do not take more then 2 tablets in 24hrs.   Ubrogepant  Tablets What is this medication? UBROGEPANT  (ue BROE je pant) treats migraines. It works by blocking a substance in the body that causes migraines. It is not used to prevent migraines. This medicine may be used for other purposes; ask your health care provider or pharmacist if you have questions. COMMON BRAND NAME(S): Ubrelvy  What should I tell my care team before I take this medication? They need to know if you have any of these conditions: Kidney disease Liver disease An unusual or allergic reaction to ubrogepant , other medications, foods, dyes, or preservatives Pregnant or trying to get pregnant Breast-feeding How should I use this medication? Take this medication by mouth with a glass of water. Take it as directed on the prescription label. You can take it with or without food. If it upsets your stomach, take it with food. Keep taking it unless your care team tells you to stop. Talk to your care team about the use of this medication in children. Special care may be needed. Overdosage: If you think you have taken too much of this medicine contact a poison control center or emergency room at once. NOTE: This medicine is only for you. Do not share this medicine with others. What if I miss a dose? This does not apply. This medication is not for regular use. What may interact with this medication? Do not take this medication with any of the  following: Adagrasib Ceritinib Certain antibiotics, such as chloramphenicol, clarithromycin, telithromycin Certain antivirals for HIV, such as atazanavir, cobicistat, darunavir, delavirdine, fosamprenavir, indinavir, ritonavir Certain medications for fungal infections, such as itraconazole, ketoconazole, posaconazole, voriconazole Conivaptan Grapefruit Idelalisib Mifepristone Nefazodone Ribociclib This medication may also interact with the following: Carvedilol Certain medications for seizures, such as phenobarbital, phenytoin Ciprofloxacin Cyclosporine Eltrombopag Fluconazole Fluvoxamine Quinidine Rifampin St. John's wort Verapamil This list may not describe all possible interactions. Give your health care provider a list of all the medicines, herbs, non-prescription drugs, or dietary supplements you use. Also tell them if you smoke, drink alcohol, or use illegal drugs. Some items may interact with your medicine. What should I watch for while using this medication? Visit your care team for regular checks on your progress. Tell your care team if your symptoms do not start to get better or if they get worse. Your mouth may get dry. Chewing sugarless gum or sucking hard candy and drinking plenty of water may help. Contact your care team if the problem does not go away or is severe. What side effects may I notice from receiving this medication? Side effects that you should  report to your care team as soon as possible: Allergic reactions--skin rash, itching, hives, swelling of the face, lips, tongue, or throat Side effects that usually do not require medical attention (report to your care team if they continue or are bothersome): Drowsiness Dry mouth Fatigue Nausea This list may not describe all possible side effects. Call your doctor for medical advice about side effects. You may report side effects to FDA at 1-800-FDA-1088. Where should I keep my medication? Keep out of the reach  of children and pets. Store between 15 and 30 degrees C (59 and 86 degrees F). Get rid of any unused medication after the expiration date. To get rid of medications that are no longer needed or have expired: Take the medication to a medication take-back program. Check with your pharmacy or law enforcement to find a location. If you cannot return the medication, check the label or package insert to see if the medication should be thrown out in the garbage or flushed down the toilet. If you are not sure, ask your care team. If it is safe to put it in the trash, pour the medication out of the container. Mix the medication with cat litter, dirt, coffee grounds, or other unwanted substance. Seal the mixture in a bag or container. Put it in the trash. NOTE: This sheet is a summary. It may not cover all possible information. If you have questions about this medicine, talk to your doctor, pharmacist, or health care provider.  2024 Elsevier/Gold Standard (2021-04-06 00:00:00)

## 2023-03-13 ENCOUNTER — Encounter: Payer: Self-pay | Admitting: Neurology

## 2023-03-13 NOTE — Addendum Note (Signed)
Addended by: Naomie Dean B on: 03/13/2023 10:26 PM   Modules accepted: Level of Service

## 2023-04-01 ENCOUNTER — Ambulatory Visit: Payer: Commercial Managed Care - PPO

## 2023-04-01 DIAGNOSIS — R519 Headache, unspecified: Secondary | ICD-10-CM | POA: Diagnosis not present

## 2023-04-01 DIAGNOSIS — H539 Unspecified visual disturbance: Secondary | ICD-10-CM | POA: Diagnosis not present

## 2023-04-01 DIAGNOSIS — R51 Headache with orthostatic component, not elsewhere classified: Secondary | ICD-10-CM

## 2023-04-01 MED ORDER — GADOBENATE DIMEGLUMINE 529 MG/ML IV SOLN
15.0000 mL | Freq: Once | INTRAVENOUS | Status: AC | PRN
  Administered 2023-04-01: 15 mL via INTRAVENOUS

## 2023-04-03 ENCOUNTER — Encounter: Payer: Self-pay | Admitting: Neurology

## 2023-07-09 ENCOUNTER — Ambulatory Visit: Payer: Commercial Managed Care - PPO | Admitting: Family

## 2023-08-12 ENCOUNTER — Other Ambulatory Visit: Payer: Self-pay | Admitting: Family

## 2023-08-12 DIAGNOSIS — G43009 Migraine without aura, not intractable, without status migrainosus: Secondary | ICD-10-CM

## 2023-08-12 MED ORDER — SUMATRIPTAN SUCCINATE 100 MG PO TABS: 100.0000 mg | ORAL_TABLET | ORAL | 2 refills | Status: DC | PRN

## 2023-08-12 MED ORDER — CYCLOBENZAPRINE HCL 5 MG PO TABS
5.0000 mg | ORAL_TABLET | Freq: Three times a day (TID) | ORAL | 2 refills | Status: AC | PRN
Start: 2023-08-12 — End: ?

## 2023-08-12 NOTE — Telephone Encounter (Signed)
 Copied from CRM [id number]. Topic: Clinical - Medication Refill >> Aug 12, 2023 10:05 AM Mesmerise C wrote: Medication:  cyclobenzaprine  (FLEXERIL ) 5 MG tablet  SUMAtriptan  (IMITREX ) 100 MG tablet   Has the patient contacted their pharmacy? No (Agent: If no, request that the patient contact the pharmacy for the refill. If patient does not wish to contact the pharmacy document the reason why and proceed with request.) (Agent: If yes, when and what did the pharmacy advise?)  This is the patient's preferred pharmacy:  [facility] [address] [place], [place] - [address] [place] [address] Phone: [phone] Fax: [phone]  Is this the correct pharmacy for this prescription? Yes If no, delete pharmacy and type the correct one.   Has the prescription been filled recently? No  Is the patient out of the medication? No  Has the patient been seen for an appointment in the last year OR does the patient have an upcoming appointment? Yes  Can we respond through MyChart? No  Agent: Please be advised that Rx refills may take up to 3 business days. We ask that you follow-up with your pharmacy.

## 2023-09-16 ENCOUNTER — Telehealth: Payer: Self-pay | Admitting: Neurology

## 2023-09-16 ENCOUNTER — Telehealth: Payer: Self-pay | Admitting: Pharmacist

## 2023-09-16 DIAGNOSIS — G43009 Migraine without aura, not intractable, without status migrainosus: Secondary | ICD-10-CM

## 2023-09-16 MED ORDER — NURTEC 75 MG PO TBDP: 75.0000 mg | ORAL_TABLET | Freq: Every day | ORAL | 4 refills | Status: DC | PRN

## 2023-09-16 NOTE — Telephone Encounter (Signed)
 Pt called wanting to know if she can get a rx sent to the pharmacy for the Nurtec. She states that so far it is working for her and she would like to continue to take it. Please advise.

## 2023-09-16 NOTE — Telephone Encounter (Signed)
 Pharmacy Patient Advocate Encounter  Received notification from [facility] that Prior Authorization for Nurtec 75MG  dispersible tablets has been APPROVED from 09/16/2023 to 09/15/2024   PA #/Case ID/Reference #: [id number]

## 2023-09-17 ENCOUNTER — Telehealth: Payer: Commercial Managed Care - PPO | Admitting: Neurology

## 2023-09-17 ENCOUNTER — Other Ambulatory Visit (HOSPITAL_COMMUNITY): Payer: Self-pay

## 2023-09-17 NOTE — Telephone Encounter (Signed)
 Noted, thanks!

## 2023-10-06 ENCOUNTER — Telehealth: Payer: Self-pay | Admitting: Neurology

## 2023-10-06 NOTE — Telephone Encounter (Signed)
 LVM FOR pT TO RESCHEDULE APPT

## 2023-12-02 ENCOUNTER — Telehealth: Payer: Self-pay

## 2023-12-02 NOTE — Telephone Encounter (Signed)
 Called and left voicemail for patient to reschedule appointment on 12/8 with Dr Ines.  If patient calls back, they can be rescheduled with Dr Chalice

## 2023-12-10 ENCOUNTER — Ambulatory Visit (INDEPENDENT_AMBULATORY_CARE_PROVIDER_SITE_OTHER): Admitting: Family

## 2023-12-10 ENCOUNTER — Encounter: Payer: Self-pay | Admitting: Family

## 2023-12-10 VITALS — BP 98/64 | HR 90 | Temp 97.0°F | Ht 60.25 in | Wt 169.5 lb

## 2023-12-10 DIAGNOSIS — F3281 Premenstrual dysphoric disorder: Secondary | ICD-10-CM | POA: Insufficient documentation

## 2023-12-10 DIAGNOSIS — R053 Chronic cough: Secondary | ICD-10-CM | POA: Diagnosis not present

## 2023-12-10 DIAGNOSIS — Z01419 Encounter for gynecological examination (general) (routine) without abnormal findings: Secondary | ICD-10-CM | POA: Diagnosis not present

## 2023-12-10 DIAGNOSIS — F988 Other specified behavioral and emotional disorders with onset usually occurring in childhood and adolescence: Secondary | ICD-10-CM | POA: Insufficient documentation

## 2023-12-10 DIAGNOSIS — G43009 Migraine without aura, not intractable, without status migrainosus: Secondary | ICD-10-CM

## 2023-12-10 DIAGNOSIS — G43809 Other migraine, not intractable, without status migrainosus: Secondary | ICD-10-CM | POA: Insufficient documentation

## 2023-12-10 DIAGNOSIS — F603 Borderline personality disorder: Secondary | ICD-10-CM | POA: Insufficient documentation

## 2023-12-10 DIAGNOSIS — N9489 Other specified conditions associated with female genital organs and menstrual cycle: Secondary | ICD-10-CM | POA: Insufficient documentation

## 2023-12-10 DIAGNOSIS — R4586 Emotional lability: Secondary | ICD-10-CM | POA: Insufficient documentation

## 2023-12-10 DIAGNOSIS — N858 Other specified noninflammatory disorders of uterus: Secondary | ICD-10-CM | POA: Insufficient documentation

## 2023-12-10 MED ORDER — AZITHROMYCIN 250 MG PO TABS: ORAL_TABLET | ORAL | 0 refills | Status: AC

## 2023-12-10 NOTE — Progress Notes (Signed)
 Patient ID: Tiffany Cooper, female    DOB: 12-Mar-1990, 33 y.o.   MRN: [id number]  Chief Complaint  Patient presents with   Migraine    Pt would like to discuss.   Discussed the use of AI scribe software for clinical note transcription with the patient, who gave verbal consent to proceed.  History of Present Illness Tiffany Cooper is a 33 year old female with migraines who presents for evaluation of her migraine management and FMLA paperwork renewal. She is accompanied by her mother.  Migraines occur with increased frequency and severity from mid-August to early December, potentially triggered by seasonal changes, hormonal fluctuations, dairy intake, and stress. Symptoms include visual auras, numbness in fingers, and heightened sense of smell prior to pain onset. Fatigue and drowsiness are noted during the fall. She uses sumatriptan , Nurtec, and Ubrelvy  for migraine management and is followed now by Neurology. Nurtec and Ubrelvy  are effective, but sumatriptan  causes tingling in the nose and throat discomfort. Ubrelvy  is preferred due to fewer side effects, though cost is a concern due to only partial insurance coverage. Silent migraines occur with auras but without significant pain. Migraine medication effectively treats headaches initially thought to be non-migrainous. FMLA is utilized for managing work responsibilities during severe migraine episodes.  She also has been having a persistent cough with creamy yellow mucus follows a cold from a month and a half ago, occurring mainly in the morning and during inactivity, with occasional chest tightness. No current cold symptoms aside from the cough.  Assessment & Plan Migraine with aura Chronic migraine with aura, exacerbated seasonally and hormonally influenced. Current treatment includes sumatriptan , Nurtec, and Ubrelvy . Sumatriptan  causes side effects; Ubrelvy  preferred. Insurance issues with Ubrelvy  and Nurtec. Preventative options  discussed, including Nurtec, Ubrelvy , Aimovig, or Ajovy. Considered aerotoxic syndrome as a contributing factor if ever having nausea, tingling, or dizziness along with the HA during or after flying. - Provide coupon for Ubrelvy  to possibly reduce cost. - Advise checking online for additional coupons or savings programs. - Discuss preventative treatment options with neurology, including daily dosing or injectables. - Renew FMLA paperwork for migraines. - Consider earlier neurology appointment to discuss preventative treatment options if needed.  Subacute cough with mucus Subacute cough with mucus persisting over a month, primarily in the morning and during inactivity. Mucus creamy yellow, initially darker during cold. No other cold symptoms. Possible progression considered. - Send Zpack, advised on dosing and possible SE.  - Continue to hydrate well, at least 2L water daily.  Need for gynecologist Would like referral to monitor her IUD and future PAP smear testing. - Send GYN referral today.  Subjective:    Outpatient Medications Prior to Visit  Medication Sig Dispense Refill   COPPER IU by Intrauterine route. 02/08/2022     cyclobenzaprine  (FLEXERIL ) 5 MG tablet Take 1-2 tablets (5-10 mg total) by mouth 3 (three) times daily as needed for muscle spasms (Migraine). Take at first sign of migraine with Sumatriptan  30 tablet 2   SUMAtriptan  (IMITREX ) 100 MG tablet Take 1 tablet (100 mg total) by mouth every 2 (two) hours as needed for migraine (Take at first of migraine with Flexeril  or Naproxen ). May repeat in 2 hours if headache persists or recurs. 20 tablet 2   Rimegepant Sulfate (NURTEC) 75 MG TBDP Take 1 tablet (75 mg total) by mouth daily as needed. For migraines. Take as close to onset of migraine as possible. One daily maximum. 16 tablet 4   Ubrogepant  (UBRELVY ) 100 MG TABS  Take 1 tablet (100 mg total) by mouth every 2 (two) hours as needed. Maximum 200mg  a day. 6 tablet 0   No  facility-administered medications prior to visit.   Past Medical History:  Diagnosis Date   Attention deficit disorder 12/10/2023   Emotional instability (HCC) 12/10/2023   Migraine    Migrainous vertigo 12/10/2023   Mood changes 12/10/2023   PMDD (premenstrual dysphoric disorder) 12/10/2023   Uterine cramping 12/10/2023   Uterine spasm 12/10/2023   Past Surgical History:  Procedure Laterality Date   BREAST REDUCTION SURGERY     2011   Allergies  Allergen Reactions   Other     Oysters    Amitriptyline Palpitations and Photosensitivity   Topiramate Other (See Comments) and Palpitations      Objective:    Physical Exam Vitals and nursing note reviewed.  Constitutional:      Appearance: Normal appearance. She is obese.  Cardiovascular:     Rate and Rhythm: Normal rate and regular rhythm.  Pulmonary:     Effort: Pulmonary effort is normal.     Breath sounds: Normal breath sounds.  Musculoskeletal:        General: Normal range of motion.  Skin:    General: Skin is warm and dry.  Neurological:     Mental Status: She is alert.  Psychiatric:        Mood and Affect: Mood normal.        Behavior: Behavior normal.    BP 98/64 (BP Location: Left Arm, Patient Position: Sitting, Cuff Size: Normal)   Pulse 90   Temp (!) 97 F (36.1 C) (Temporal)   Ht 5' 0.25 (1.53 m)   Wt 169 lb 8 oz (76.9 kg)   LMP 11/27/2023 (Exact Date)   SpO2 97%   BMI 32.83 kg/m  Wt Readings from Last 3 Encounters:  12/10/23 169 lb 8 oz (76.9 kg)  03/11/23 164 lb 3.2 oz (74.5 kg)  01/14/23 162 lb 2 oz (73.5 kg)      Lucius Krabbe, NP

## 2023-12-11 ENCOUNTER — Telehealth: Payer: Self-pay | Admitting: Family

## 2023-12-11 NOTE — Telephone Encounter (Signed)
 [employer] faxed HCP certification of employee condition form, to be filled out by provider. Patient requested to send it back via Fax within ASAP. Document is located in providers tray at front office.Please advise at [phone].

## 2023-12-12 NOTE — Telephone Encounter (Signed)
Received, Placed on providers desk.

## 2023-12-18 ENCOUNTER — Encounter: Payer: Self-pay | Admitting: Family

## 2023-12-18 ENCOUNTER — Ambulatory Visit: Payer: Self-pay

## 2023-12-21 DIAGNOSIS — Z0279 Encounter for issue of other medical certificate: Secondary | ICD-10-CM

## 2023-12-25 ENCOUNTER — Telehealth: Payer: Self-pay

## 2023-12-25 NOTE — Telephone Encounter (Signed)
 Form has been faxed again.    Copied from CRM #[id number]. Topic: Medical Record Request - Other >> Dec 25, 2023 11:08 AM Ashley R wrote: Reason for CRM: FMLA status request, email recd yesterday that it has not been sent in yet. deadline approaching - Nov 19

## 2024-02-02 ENCOUNTER — Ambulatory Visit: Admitting: Neurology

## 2024-02-02 ENCOUNTER — Telehealth: Payer: Self-pay | Admitting: Neurology

## 2024-02-02 ENCOUNTER — Telehealth: Admitting: Neurology

## 2024-02-02 NOTE — Progress Notes (Deleted)
 Makensie Mulhall is a 33 y.o. female here as requested by Stamey, Chiquita POUR, FNP for migraines. has Migraine without aura and without status migrainosus, not intractable and HSV-1 (herpes simplex virus 1) infection on their problem list.   She had headaches since a child worse around puberty. Her mother had migraines. The migraines used to be triggered by severe stress or illness but since she started keeping a journal, she had one so severe even coffee smell made it worse. Noise and brights lights bother her and she is sensitive to these.  Milk or dairy can give her a headache, she is lactose intolerant, she has had them at work or at home, no particular location triggers them, no particular time of day she can feel off before the headache and she can tell. 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 In June she would take tylenol extra strength at the onset and now more resistant to these over the counter meds but she has always used them. Also increased frequency since June 2024 she has had more prologed headaches. She also has associated nausea, no vomiting, movement hurts, throbbing/pulsating/pounding can be unilateral.  No aura.  However she has noticed that she can have vision changes, the headaches can be positional and they are worse in the morning when she wakes up with a migraine so positional, she can have morning and nocturnal migraines, the headaches are worsening.  She can have up to 8 total headache days a month with 4 of those being migrainous ongoing for the last 3 months.No other focal neurologic deficits, associated symptoms, inciting events or modifiable factors.

## 2024-02-02 NOTE — Telephone Encounter (Signed)
 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 Patient called at 8:23am

## 2024-02-03 ENCOUNTER — Encounter: Payer: Self-pay | Admitting: Neurology

## 2024-02-03 ENCOUNTER — Ambulatory Visit: Admitting: Neurology

## 2024-02-03 VITALS — BP 113/69 | HR 74 | Ht 60.0 in | Wt 169.6 lb

## 2024-02-03 DIAGNOSIS — G43009 Migraine without aura, not intractable, without status migrainosus: Secondary | ICD-10-CM

## 2024-02-03 DIAGNOSIS — G43809 Other migraine, not intractable, without status migrainosus: Secondary | ICD-10-CM

## 2024-02-03 MED ORDER — SUMATRIPTAN SUCCINATE 100 MG PO TABS: 100.0000 mg | ORAL_TABLET | ORAL | 2 refills | Status: AC | PRN

## 2024-02-03 MED ORDER — UBRELVY 100 MG PO TABS: 100.0000 mg | ORAL_TABLET | ORAL | 5 refills | Status: AC | PRN

## 2024-02-03 MED ORDER — NURTEC 75 MG PO TBDP: ORAL_TABLET | ORAL | 5 refills | Status: AC

## 2024-02-03 NOTE — Patient Instructions (Signed)
 Chronic Migraine Headache A migraine headache is throbbing pain that is usually on one side of the head. It can also cause other symptoms. A migraine is called a chronic migraine if it happens at least 15 days in a month for more than 3 months. Talk with your doctor about what things may bring on (trigger) your migraines. What are the causes? The exact cause of a migraine is not known. This condition may be brought on or caused by: Smoking. Some medicines, such as birth control or some blood pressure medicines. Certain substances in some foods or drinks. Foods and drinks, such as: Cheese. Chocolate. Alcohol. Caffeine. Other things that may bring on a migraine include: Periods. Stress. Getting too much or too little sleep. Feeling tired (fatigue). Bright lights or loud noises. Certain smells. Weather changes and being at high altitude. What increases the risk? The following factors may make you more likely to have chronic migraines: Having migraines or family members who have them. Having a mental health condition, such as being sad (depressed) or feeling worried or nervous (anxious). Taking a lot of pain medicine. Having sleep problems. Having heart disease, diabetes, or being very overweight (obese). What are the signs or symptoms? Symptoms vary for each person. The pain may: Feel like it is pulsing or throbbing. Happen only on one side of the head. In some cases, the pain may be on both sides of the head or around the head or neck. Be so bad that it keeps you from doing daily activities. Get worse with activity. Make you feel like you may vomit (feel nauseous) or vomit. Get worse around bright lights, loud noises, or smells. Cause you to feel dizzy. A sign that you may have chronic migraines is when you start to have more and more migraines. How is this treated? This condition is treated with medicines that: Lessen pain and the feeling like you may vomit. Prevent  migraines. Treatment may also include: Acupuncture. Changes to your diet or sleep. Relaxation training. Learning ways to control your body and breathing (biofeedback). Talk therapy to help you know and deal with negative thoughts (cognitive behavioral therapy). Using a device that gives electrical stimulation to your nerves, which can help take away pain. A shot of medicine into the muscles of the face or head. Surgery, if the other treatments do not work. Follow these instructions at home: Medicines Take over-the-counter and prescription medicines only as told by your doctor. Ask your doctor if the medicine prescribed to you requires you to avoid driving or using machinery. Lifestyle  Do not drink alcohol. Do not smoke or use any products that contain nicotine or tobacco. If you need help quitting, ask your doctor. Get 7-9 hours of sleep every night, or the amount of sleep recommended by your doctor. Lower the stress in your life. Ask your doctor about ways to do this. Stay at a healthy weight. Talk with your doctor if you need help losing weight. Get regular exercise. General instructions Keep a journal to find out if certain things bring on migraines. This can help you avoid those things. For example, write down: What you eat and drink. How much sleep you get. Any change to your diet or medicines. Lie down in a dark, quiet room when you have a migraine. Try placing a cool towel over your head when you have a migraine. Keep lights dim if bright lights bother you or make your migraines worse. Where to find more information Coalition for Headache and  Migraine Patients (CHAMP): headachemigraine.org American Migraine Foundation: americanmigrainefoundation.org National Headache Foundation: headaches.org Contact a doctor if: Medicine does not help your migraine. Your pain keeps coming back even with medicine. Get help right away if: Your migraine becomes really bad and medicine does  not help. You have a fever or stiff neck. You have trouble seeing. Your muscles are weak or you lose control of them. You lose your balance or have trouble walking. You feel like you may faint or you faint. You start having sudden, very bad headaches. You have a seizure. This information is not intended to replace advice given to you by your health care provider. Make sure you discuss any questions you have with your health care provider. Document Revised: 10/08/2021 Document Reviewed: 10/08/2021 Elsevier Patient Education  2024 [employer].

## 2024-02-03 NOTE — Progress Notes (Signed)
 Provider:  Dedra Gores,   Primary Care Physician:  Lucius Krabbe, NP [address] [place] [address]     Referring Provider: Lucius Krabbe, Np [address] [place],  [place] [address]          Chief Complaint according to patient   Patient presents with:          Had new onset migraines last year, seen dr Ines, HA journal showed correlation to her menstruation, had a peak pain season in August having 15 months , have improved since thanksgiving. 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 Sleep affects migraines,        HISTORY OF PRESENT ILLNESS:  Tiffany Cooper is a 33 y.o. female patient who is here for revisit 02/03/2024 , TOC from dr Ines.  Nurtec works well for her but was not covered by insurance. Insurance changes in January-  Flexaril works for sleep and relaxation, but cannot be working under the influence.  Sumatriptan  50 mg not as effective.  Topiramate had severe side effects, cognitive, not able to work Amitriptyline and imipramine left her agitated and with a brain fog. .    Chief concern according to patient : Nurtec QOD was working very well for acute headaches, she now needs relief and prevention. SABRA Loan Hx : see previous note/ Migraine : mother , MGF   Social HX; see previous note:  Flight attendant.   Tiffany Cooper is a 33 year old female with migraines who presents for evaluation of her migraine management and FMLA paperwork renewal. She is accompanied by her mother.   PCP : Migraines occur with increased frequency and severity from mid-August to early December, potentially triggered by seasonal changes, hormonal fluctuations, dairy intake, and stress. Symptoms include visual auras, numbness in fingers, and heightened sense of smell prior to pain onset. Fatigue and drowsiness are noted during the fall. She uses sumatriptan , Nurtec, and Ubrelvy  for migraine  management and is followed now by Neurology. Nurtec and Ubrelvy  are effective, but sumatriptan  causes tingling in the nose and throat discomfort. Ubrelvy  is preferred due to fewer side effects, though cost is a concern due to only partial insurance coverage. Silent migraines occur with auras but without significant pain. Migraine medication effectively treats headaches initially thought to be non-migrainous. FMLA is utilized for managing work responsibilities during severe migraine episodes.   She also has been having a persistent cough with creamy yellow mucus follows a cold from a month and a half ago, occurring mainly in the morning and during inactivity, with occasional chest tightness. No current cold symptoms aside from the cough.   PCP :  Migraine with aura Chronic migraine with aura, exacerbated seasonally and hormonally influenced. Current treatment includes sumatriptan , Nurtec, and Ubrelvy . Sumatriptan  causes side effects; Ubrelvy  preferred. Insurance issues with Ubrelvy  and Nurtec. Preventative options discussed, including Nurtec, Ubrelvy , Aimovig, or Ajovy. Considered aerotoxic syndrome as a contributing factor if ever having nausea, tingling, or dizziness along with the HA during or after flying. - Provide coupon for Ubrelvy  to possibly reduce cost. - Advise checking online for additional coupons or savings programs. - Discuss preventative treatment options with neurology, including daily dosing or injectables. - Renew FMLA paperwork for migraines. - Consider earlier neurology appointment to discuss preventative treatment options if needed.       Review of Systems: Out of a  complete 14 system review, the patient complains of only the following symptoms, and all other reviewed systems are negative.:   SLEEPINESS ?  How likely are you to doze in the following situations: 0 = not likely, 1 = slight chance, 2 = moderate chance, 3 = high chance  Sitting and Reading? Watching  Television? Sitting inactive in a public place (theater or meeting)? Lying down in the afternoon when circumstances permit? Sitting and talking to someone? Sitting quietly after lunch without alcohol? In a car, while stopped for a few minutes in traffic? As a passenger in a car for an hour without a break?  Total =   8/ 24   FSS :  very high when she has frequent migraines.        Social History   Socioeconomic History   Marital status: Single    Spouse name: Not on file   Number of children: Not on file   Years of education: Not on file   Highest education level: Associate degree: occupational, scientist, product/process development, or vocational program  Occupational History   Not on file  Tobacco Use   Smoking status: Never   Smokeless tobacco: Never  Vaping Use   Vaping status: Never Used  Substance and Sexual Activity   Alcohol use: Yes    Comment: rare   Drug use: Never   Sexual activity: Not on file  Other Topics Concern   Not on file  Social History Narrative   Caffiene 1-2 black tea, ocass [employer]   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, no kids, pets   Social Drivers of Corporate Investment Banker Strain: Medium Risk (12/06/2023)   Overall Financial Resource Strain (CARDIA)    Difficulty of Paying Living Expenses: Somewhat hard  Food Insecurity: Food Insecurity Present (12/06/2023)   Hunger Vital Sign    Worried About Running Out of Food in the Last Year: Sometimes true    Ran Out of Food in the Last Year: Sometimes true  Transportation Needs: No Transportation Needs (12/06/2023)   PRAPARE - Administrator, Civil Service (Medical): No    Lack of Transportation (Non-Medical): No  Physical Activity: Sufficiently Active (12/06/2023)   Exercise Vital Sign    Days of Exercise per Week: 3 days    Minutes of Exercise per Session: 100 min  Stress: No Stress Concern Present (12/06/2023)   [employer] of Occupational Health - Occupational Stress  Questionnaire    Feeling of Stress: Not at all  Social Connections: Moderately Isolated (12/06/2023)   Social Connection and Isolation Panel    Frequency of Communication with Friends and Family: Three times a week    Frequency of Social Gatherings with Friends and Family: Once a week    Attends Religious Services: Never    Database Administrator or Organizations: No    Attends Engineer, Structural: Not on file    Marital Status: Living with partner    Family History  Problem Relation Age of Onset   Migraines Mother    Migraines Maternal Grandfather     Past Medical History:  Diagnosis Date   Attention deficit disorder 12/10/2023   Emotional instability (HCC) 12/10/2023   Migraine    Migrainous vertigo 12/10/2023   Mood changes 12/10/2023   PMDD (premenstrual dysphoric disorder) 12/10/2023   Uterine cramping 12/10/2023   Uterine spasm 12/10/2023    Past Surgical History:  Procedure Laterality Date   BREAST REDUCTION SURGERY  2011     Current Outpatient Medications on File Prior to Visit  Medication Sig Dispense Refill   COPPER IU by Intrauterine route. 02/08/2022     cyclobenzaprine  (FLEXERIL ) 5 MG tablet Take 1-2 tablets (5-10 mg total) by mouth 3 (three) times daily as needed for muscle spasms (Migraine). Take at first sign of migraine with Sumatriptan  30 tablet 2   SUMAtriptan  (IMITREX ) 100 MG tablet Take 1 tablet (100 mg total) by mouth every 2 (two) hours as needed for migraine (Take at first of migraine with Flexeril  or Naproxen ). May repeat in 2 hours if headache persists or recurs. 20 tablet 2   Rimegepant Sulfate (NURTEC) 75 MG TBDP Take 1 tablet (75 mg total) by mouth daily as needed. For migraines. Take as close to onset of migraine as possible. One daily maximum. (Patient not taking: Reported on 02/03/2024) 16 tablet 4   Ubrogepant  (UBRELVY ) 100 MG TABS Take 1 tablet (100 mg total) by mouth every 2 (two) hours as needed. Maximum 200mg  a day. (Patient  not taking: Reported on 02/03/2024) 6 tablet 0   No current facility-administered medications on file prior to visit.    Allergies  Allergen Reactions   Other     Oysters    Amitriptyline Palpitations and Photosensitivity   Topiramate Other (See Comments) and Palpitations     DIAGNOSTIC DATA (LABS, IMAGING, TESTING) - I reviewed patient records, labs, notes, testing and imaging myself where available.  Lab Results  Component Value Date   WBC 6.4 01/08/2023   HGB 14.2 01/08/2023   HCT 41.5 01/08/2023   MCV 90.0 01/08/2023   PLT 346.0 01/08/2023      Component Value Date/Time   NA 137 01/08/2023 1059   K 3.9 01/08/2023 1059   CL 104 01/08/2023 1059   CO2 27 01/08/2023 1059   GLUCOSE 85 01/08/2023 1059   BUN 6 01/08/2023 1059   CREATININE 0.78 01/08/2023 1059   CALCIUM 9.3 01/08/2023 1059   PROT 7.4 01/08/2023 1059   ALBUMIN 4.4 01/08/2023 1059   AST 18 01/08/2023 1059   ALT 12 01/08/2023 1059   ALKPHOS 47 01/08/2023 1059   BILITOT 0.5 01/08/2023 1059   Lab Results  Component Value Date   CHOL 190 01/08/2023   HDL 52.60 01/08/2023   LDLCALC 120 (H) 01/08/2023   TRIG 86.0 01/08/2023   CHOLHDL 4 01/08/2023   No results found for: HGBA1C No results found for: VITAMINB12 Lab Results  Component Value Date   TSH 1.50 01/08/2023    PHYSICAL EXAM:  Vitals:   02/03/24 1329  BP: 113/69  Pulse: 74   No data found. Body mass index is 33.12 kg/m.   Wt Readings from Last 3 Encounters:  02/03/24 169 lb 9.6 oz (76.9 kg)  12/10/23 169 lb 8 oz (76.9 kg)  03/11/23 164 lb 3.2 oz (74.5 kg)     Ht Readings from Last 3 Encounters:  02/03/24 5' (1.524 m)  12/10/23 5' 0.25 (1.53 m)  03/11/23 5' 0.25 (1.53 m)      General: The patient is awake, alert and appears not in acute distress and groomed. Head: Normocephalic, atraumatic.  Neck is supple.   Nasal airflow  patent.   Overbite , mild.  Dental status:  biological  Cardiovascular:  Regular rate and  cardiac rhythm by pulse, without distended neck veins. Respiratory: no shortness of breath  Skin:  Without evidence of ankle edema, or rash. Trunk: BMI is 33.21     NEUROLOGIC EXAM: The  patient is awake and alert, oriented to place and time.   Memory subjective described as intact.  Attention span & concentration ability appears normal.   Speech is fluent,  without  dysarthria, dysphonia or aphasia.  Mood and affect are appropriate.   Neurological Examination: Mental Status: Intact. Language and speech are normal. No cognitive deficits. Cranial Nerves II-XII: Intact. PERL. EOMI.  VFF. No nystagmus.  No facial droop.  No ptosis.  Hearing is grossly intact bilaterally.  The tongue is normal and midline. Motor: Strengths are 5/5 throughout. Muscle bulk and tone are normal. No tremors.  Coordination: No ataxia or dysmetria.  Sensory: Grossly intact throughout to all modalities. Reflexes: Normal and symmetric throughout. No ankle clonus. Babinski's sign is absent bilaterally. Hoffman's sign is absent bilaterally. Gait and Station: Normal. Romberg's sign is absent.   ASSESSMENT AND PLAN :   33 y.o. year old female  here with:    1) chronic migraine : with aura sometimes; 15 or more a months.    Tingly fingers, lightheadedness, nausea, no vomiting. Vertigo sometimes. Photophobia, osmophobia, phonophobia. SABRA    PLAN : Start NURTEC 75 mg, QOD  prevention.  Sumatriptan  100 mg for abortive medication.   Ubrelvy  for acute migraine prn.   Rv with NP in 6 months .      I would like to thank Lucius Krabbe, NP and Lucius Krabbe, Np [address] [place],  [place] [address] for allowing me to meet with this pleasant patient.      The patient will be seen in follow-up in the  clinic at [facility] for discussion of test results,  related symptoms and treatment compliance review, further management strategies, etc.   The referring provider will be notified of the test results.    The patient's condition requires frequent monitoring and adjustments in the treatment plan, reflecting the ongoing complexity of care.  This provider is the continuing focal point for all needed services for this condition.  After spending a total time of  35  minutes face to face and time for  history taking, physical and neurologic examination, review of laboratory studies,  personal review of imaging studies, reports and results of other testing and review of referral information / records as far as provided in visit,   Electronically signed by: Dedra Gores, MD 02/03/2024 2:00 PM  [facility] Board certified by The [employer] of Sleep Medicine and Diplomate of the [employer] of Sleep Medicine. Board certified In Neurology through the ABPN, Fellow of the [employer] of Neurology.

## 2024-02-04 ENCOUNTER — Other Ambulatory Visit (HOSPITAL_COMMUNITY): Payer: Self-pay

## 2024-02-04 ENCOUNTER — Telehealth: Payer: Self-pay

## 2024-02-04 NOTE — Telephone Encounter (Signed)
 Pharmacy Patient Advocate Encounter   Received notification from CoverMyMeds that prior authorization for Ubrelvy  100MG  tablets is required/requested.   Insurance verification completed.   The patient is insured through [facility].   Per test claim: PA required; PA started via CoverMyMeds. KEY [id number] . Waiting for clinical questions to populate.

## 2024-02-05 ENCOUNTER — Other Ambulatory Visit (HOSPITAL_COMMUNITY): Payer: Self-pay

## 2024-02-05 NOTE — Telephone Encounter (Signed)
 Found this from a January 2025 OV. Will this work?

## 2024-02-05 NOTE — Telephone Encounter (Signed)
 Pharmacy Patient Advocate Encounter  Prior Authorization form/request asks a question that requires your assistance. Please see the question below and advise accordingly. The PA will not be submitted until the necessary information is received.

## 2024-02-05 NOTE — Telephone Encounter (Signed)
 Pharmacy Patient Advocate Encounter  Received notification from [facility] that Prior Authorization for Ubrelvy  100MG  tablets has been APPROVED from 02-05-2024 to 02-04-2025. Ran test claim, Copay is $0.00 for 10 tablets per 30 days. This test claim was processed through [facility]- copay amounts may vary at other pharmacies due to pharmacy/plan contracts, or as the patient moves through the different stages of their insurance plan.   PA #/Case ID/Reference #: [id number]

## 2024-08-24 ENCOUNTER — Ambulatory Visit: Admitting: Adult Health

## 2024-09-28 ENCOUNTER — Ambulatory Visit: Admitting: Adult Health
